# Patient Record
Sex: Male | Born: 1955
Health system: Southern US, Community
[De-identification: ages and names within clinical notes are randomized; demographics above are authoritative.]

## PROBLEM LIST (undated history)

## (undated) DIAGNOSIS — F1910 Other psychoactive substance abuse, uncomplicated: Secondary | ICD-10-CM

## (undated) DIAGNOSIS — G8929 Other chronic pain: Secondary | ICD-10-CM

## (undated) DIAGNOSIS — I10 Essential (primary) hypertension: Secondary | ICD-10-CM

## (undated) DIAGNOSIS — R7303 Prediabetes: Secondary | ICD-10-CM

## (undated) DIAGNOSIS — N189 Chronic kidney disease, unspecified: Secondary | ICD-10-CM

## (undated) DIAGNOSIS — E785 Hyperlipidemia, unspecified: Secondary | ICD-10-CM

## (undated) DIAGNOSIS — K219 Gastro-esophageal reflux disease without esophagitis: Secondary | ICD-10-CM

## (undated) DIAGNOSIS — N529 Male erectile dysfunction, unspecified: Secondary | ICD-10-CM

## (undated) DIAGNOSIS — R011 Cardiac murmur, unspecified: Secondary | ICD-10-CM

## (undated) DIAGNOSIS — M549 Dorsalgia, unspecified: Secondary | ICD-10-CM

## (undated) HISTORY — DX: Prediabetes: R73.03

## (undated) HISTORY — DX: Essential (primary) hypertension: I10

## (undated) HISTORY — DX: Cardiac murmur, unspecified: R01.1

## (undated) HISTORY — DX: Male erectile dysfunction, unspecified: N52.9

## (undated) HISTORY — DX: Hyperlipidemia, unspecified: E78.5

## (undated) HISTORY — DX: Dorsalgia, unspecified: M54.9

## (undated) HISTORY — DX: Other psychoactive substance abuse, uncomplicated: F19.10

## (undated) HISTORY — DX: Other chronic pain: G89.29

## (undated) HISTORY — DX: Chronic kidney disease, unspecified: N18.9

---

## 1994-11-21 HISTORY — PX: OTHER SURGICAL HISTORY: SHX169

## 1999-03-10 ENCOUNTER — Encounter: Admission: RE | Admit: 1999-03-10 | Discharge: 1999-03-10 | Payer: Self-pay | Admitting: Family Medicine

## 1999-04-01 ENCOUNTER — Ambulatory Visit (HOSPITAL_COMMUNITY): Admission: RE | Admit: 1999-04-01 | Discharge: 1999-04-01 | Payer: Self-pay | Admitting: Family Medicine

## 1999-04-01 ENCOUNTER — Encounter: Admission: RE | Admit: 1999-04-01 | Discharge: 1999-04-01 | Payer: Self-pay | Admitting: Family Medicine

## 1999-04-02 ENCOUNTER — Encounter: Admission: RE | Admit: 1999-04-02 | Discharge: 1999-04-02 | Payer: Self-pay | Admitting: Family Medicine

## 1999-04-14 ENCOUNTER — Ambulatory Visit (HOSPITAL_COMMUNITY): Admission: RE | Admit: 1999-04-14 | Discharge: 1999-04-14 | Payer: Self-pay | Admitting: Family Medicine

## 1999-04-16 ENCOUNTER — Encounter: Admission: RE | Admit: 1999-04-16 | Discharge: 1999-04-16 | Payer: Self-pay | Admitting: Family Medicine

## 2001-04-18 ENCOUNTER — Encounter: Admission: RE | Admit: 2001-04-18 | Discharge: 2001-04-18 | Payer: Self-pay | Admitting: Family Medicine

## 2001-11-15 ENCOUNTER — Encounter: Admission: RE | Admit: 2001-11-15 | Discharge: 2001-11-15 | Payer: Self-pay | Admitting: Family Medicine

## 2001-11-26 ENCOUNTER — Encounter: Admission: RE | Admit: 2001-11-26 | Discharge: 2001-11-26 | Payer: Self-pay | Admitting: Sports Medicine

## 2002-02-18 ENCOUNTER — Ambulatory Visit: Admission: RE | Admit: 2002-02-18 | Discharge: 2002-02-18 | Payer: Self-pay | Admitting: Internal Medicine

## 2002-05-13 ENCOUNTER — Encounter: Admission: RE | Admit: 2002-05-13 | Discharge: 2002-05-13 | Payer: Self-pay | Admitting: Family Medicine

## 2002-08-12 ENCOUNTER — Encounter: Admission: RE | Admit: 2002-08-12 | Discharge: 2002-08-12 | Payer: Self-pay | Admitting: Family Medicine

## 2002-12-05 ENCOUNTER — Encounter: Admission: RE | Admit: 2002-12-05 | Discharge: 2002-12-05 | Payer: Self-pay | Admitting: Family Medicine

## 2005-01-04 ENCOUNTER — Ambulatory Visit: Payer: Self-pay | Admitting: Sports Medicine

## 2005-01-05 ENCOUNTER — Ambulatory Visit: Payer: Self-pay | Admitting: Family Medicine

## 2005-03-03 ENCOUNTER — Ambulatory Visit: Payer: Self-pay | Admitting: Family Medicine

## 2007-01-18 DIAGNOSIS — E785 Hyperlipidemia, unspecified: Secondary | ICD-10-CM | POA: Insufficient documentation

## 2007-01-18 DIAGNOSIS — F528 Other sexual dysfunction not due to a substance or known physiological condition: Secondary | ICD-10-CM | POA: Insufficient documentation

## 2007-01-18 DIAGNOSIS — E669 Obesity, unspecified: Secondary | ICD-10-CM | POA: Insufficient documentation

## 2007-04-19 ENCOUNTER — Ambulatory Visit: Payer: Self-pay | Admitting: Family Medicine

## 2007-04-19 ENCOUNTER — Encounter: Payer: Self-pay | Admitting: Family Medicine

## 2007-04-19 LAB — CONVERTED CEMR LAB
Albumin: 4.4 g/dL (ref 3.5–5.2)
Alkaline Phosphatase: 44 units/L (ref 39–117)
CO2: 26 meq/L (ref 19–32)
Glucose, Bld: 95 mg/dL (ref 70–99)
LDL Cholesterol: 151 mg/dL — ABNORMAL HIGH (ref 0–99)
Potassium: 4 meq/L (ref 3.5–5.3)
Sodium: 142 meq/L (ref 135–145)
Total Protein: 7.1 g/dL (ref 6.0–8.3)
Triglycerides: 89 mg/dL (ref <150)

## 2007-04-20 ENCOUNTER — Encounter: Payer: Self-pay | Admitting: Family Medicine

## 2007-04-27 ENCOUNTER — Telehealth: Payer: Self-pay | Admitting: Family Medicine

## 2007-05-01 ENCOUNTER — Ambulatory Visit: Payer: Self-pay | Admitting: Vascular Surgery

## 2007-05-01 ENCOUNTER — Telehealth: Payer: Self-pay | Admitting: *Deleted

## 2007-05-01 ENCOUNTER — Ambulatory Visit (HOSPITAL_COMMUNITY): Admission: RE | Admit: 2007-05-01 | Discharge: 2007-05-01 | Payer: Self-pay | Admitting: Family Medicine

## 2008-12-15 ENCOUNTER — Ambulatory Visit: Payer: Self-pay | Admitting: Family Medicine

## 2008-12-15 ENCOUNTER — Encounter (INDEPENDENT_AMBULATORY_CARE_PROVIDER_SITE_OTHER): Payer: Self-pay | Admitting: Family Medicine

## 2008-12-17 ENCOUNTER — Encounter (INDEPENDENT_AMBULATORY_CARE_PROVIDER_SITE_OTHER): Payer: Self-pay | Admitting: Family Medicine

## 2008-12-19 ENCOUNTER — Ambulatory Visit: Payer: Self-pay | Admitting: Family Medicine

## 2008-12-19 LAB — CONVERTED CEMR LAB
Ketones, urine, test strip: NEGATIVE
Nitrite: NEGATIVE
Protein, U semiquant: NEGATIVE
Urobilinogen, UA: 0.2

## 2008-12-22 ENCOUNTER — Encounter (INDEPENDENT_AMBULATORY_CARE_PROVIDER_SITE_OTHER): Payer: Self-pay | Admitting: Family Medicine

## 2008-12-22 LAB — CONVERTED CEMR LAB
Albumin: 4.3 g/dL (ref 3.5–5.2)
Alkaline Phosphatase: 51 units/L (ref 39–117)
BUN: 13 mg/dL (ref 6–23)
CO2: 25 meq/L (ref 19–32)
Calcium: 9.1 mg/dL (ref 8.4–10.5)
Chloride: 105 meq/L (ref 96–112)
Cholesterol: 192 mg/dL (ref 0–200)
Glucose, Bld: 106 mg/dL — ABNORMAL HIGH (ref 70–99)
HDL: 44 mg/dL (ref >39)
Hemoglobin: 13.5 g/dL (ref 13.0–17.0)
MCHC: 32.3 g/dL (ref 30.0–36.0)
Potassium: 4 meq/L (ref 3.5–5.3)
RBC: 5.14 M/uL (ref 4.22–5.81)
Triglycerides: 70 mg/dL (ref <150)

## 2009-05-22 ENCOUNTER — Ambulatory Visit: Payer: Self-pay | Admitting: Family Medicine

## 2009-05-22 DIAGNOSIS — R7309 Other abnormal glucose: Secondary | ICD-10-CM | POA: Insufficient documentation

## 2009-05-22 DIAGNOSIS — K219 Gastro-esophageal reflux disease without esophagitis: Secondary | ICD-10-CM | POA: Insufficient documentation

## 2009-05-22 LAB — CONVERTED CEMR LAB: Hgb A1c MFr Bld: 6.3 %

## 2010-09-23 ENCOUNTER — Encounter: Payer: Self-pay | Admitting: Family Medicine

## 2010-12-03 ENCOUNTER — Ambulatory Visit
Admission: RE | Admit: 2010-12-03 | Discharge: 2010-12-03 | Payer: Self-pay | Source: Home / Self Care | Attending: Family Medicine | Admitting: Family Medicine

## 2010-12-03 DIAGNOSIS — K089 Disorder of teeth and supporting structures, unspecified: Secondary | ICD-10-CM | POA: Insufficient documentation

## 2010-12-03 DIAGNOSIS — I10 Essential (primary) hypertension: Secondary | ICD-10-CM | POA: Insufficient documentation

## 2010-12-23 ENCOUNTER — Encounter: Payer: Self-pay | Admitting: *Deleted

## 2010-12-23 NOTE — Miscellaneous (Signed)
Summary: update problem list  Clinical Lists Changes  Problems: Removed problem of ANAPHYLACTIC REACTION (ICD-995.0) Removed problem of ANEMIA, OTHER, UNSPECIFIED (ICD-285.9) Removed problem of CAROTID BRUIT, RIGHT (ICD-785.9) Removed problem of SYSTOLIC MURMUR (OJJ-009.2) Removed problem of IRRITABLE BOWEL SYNDROME (ICD-564.1) Observations: Added new observation of PAST MED HX: systolic murmur Anemia-iron deficiency Hyperlipidemia chonic low back pain impotence  Right carotid bruit Pre-diabetic IBS (09/23/2010 12:14)      Past Medical History:    systolic murmur    Anemia-iron deficiency    Hyperlipidemia    chonic low back pain    impotence     Right carotid bruit    Pre-diabetic    IBS

## 2010-12-23 NOTE — Assessment & Plan Note (Signed)
Summary: tooth pain, HTN,df   Vital Signs:  Patient profile:   55 year old male Height:      70 inches Weight:      282 pounds BMI:     40.61 Temp:     98.1 degrees F oral Pulse rate:   75 / minute Pulse rhythm:   regular BP sitting:   165 / 95  (left arm) Cuff size:   large  Vitals Entered By: Loralee Pacas CMA (December 03, 2010 4:39 PM) CC: tooth pain Is Patient Diabetic? No Pain Assessment Patient in pain? no      Comments tooth abcess   Primary Care Provider:  Milinda Antis MD  CC:  tooth pain.  History of Present Illness: 55 yo here for acute appt for evaluation of tooth pain  tooth pain:  chronic dental caries, has been putting off dentistry appt due to financial strain and new job.  for teh past 3 days feels right side of face has been swelling more.  no fever, trouble swallowing, drainage, trouble eating.  Pain well controlled on occaisiona ibuprofen.  hypertension:  patient states he has not followed up with PCP because he is working on diet and exercise adn wante dto make some improvement on this before comign back in as he does not want to be on medications.  NO CP, Dyspnnea  obesity:  states he has lost 20 pounds in the past few months due to working on lifestyle modifcation.  AT patient's last appt with PCP was -prediabetic.  Habits & Providers  Alcohol-Tobacco-Diet     Tobacco Status: quit     Year Quit: 1995  Exercise-Depression-Behavior     Have you felt down or hopeless? no     Have you felt little pleasure in things? no     Depression Counseling: not indicated; screening negative for depression     Seat Belt Use: always  Current Medications (verified): 1)  Viagra 50 Mg Tabs (Sildenafil Citrate) .Marland Kitchen.. 1-2  By Mouth 30 Minutes Prior To Sexual Activity 2)  Omeprazole 40 Mg Cpdr (Omeprazole) .Marland Kitchen.. 1 By Mouth Daily For Heartburn 3)  Epipen 0.3 Mg/0.31ml (1:1000) Devi (Epinephrine Hcl (Anaphylaxis)) .... Use As Directed For [facility] in [place]  Compartment 4)  Penicillin V Potassium 500 Mg Tabs (Penicillin V Potassium) .... One Tab Four Times A Day For 14 Days 5)  Hydrochlorothiazide 25 Mg Tabs (Hydrochlorothiazide) .... Take One Tablet Daily For Blood Pressure  Allergies: No Known Drug Allergies PMH-FH-SH reviewed for relevance  Social History: Risk analyst Use:  always  Review of Systems      See HPI  Physical Exam  General:  Well-developed Overweight,in no acute distress; alert,appropriate and cooperative throughout examination Mouth:  multiple chipped, cracked teeth.  slight facial swelling over righ lower face.  Cracked right upper ? incisor without drainage, obvious swelling or abscess. Lungs:  Normal respiratory effort, chest expands symmetrically. Lungs are clear to auscultation, no crackles or wheezes. Heart:  Normal rate and regular rhythm. S1 and S2 normal without murmur   Impression & Recommendations:  Problem # 1:  DENTAL PAIN (ICD-525.9)  will prescribe penicillin until patietn can see dentistry.  She is insured and states he will be able to be seen in the next week.  Pain well controlled with some ibuprofen.  Orders: [facility]- Est  Level 4 (11914)  Problem # 2:  HYPERTENSION (ICD-401.9)  has been borderline hypertensive in the past, today significantly elevated at 165/95.  patient relcutant  to start medications.  Discussed startign today while he continues to work on weight loss.  WIll only start one agent at this time du eto reluctance but we did discuss that there is a combination pill available which would also be a good choice should his blood pressure remain elevated (lisinopril/hctx given prediabetes)  Patient will follow-up with PCP in several weeks and will obtain fasting labs at that time.  His updated medication list for this problem includes:    Hydrochlorothiazide 25 Mg Tabs (Hydrochlorothiazide) .Marland Kitchen... Take one tablet daily for blood pressure  BP today: 165/95 Prior BP: 140/80 (05/22/2009)  Labs  Reviewed: K+: 4.0 (12/15/2008) Creat: : 0.89 (12/15/2008)   Chol: 192 (12/15/2008)   HDL: 44 (12/15/2008)   LDL: 134 (12/15/2008)   TG: 70 (12/15/2008)  Orders: [facility]- Est  Level 4 (16109)  Problem # 3:  OBESITY, NOS (ICD-278.00)  Congratulated on weight loss and encouraged contiued loss to positively impact his health.  Patient will follow-up with PCP.    Ht: 70 (12/03/2010)   Wt: 282 (12/03/2010)   BMI: 40.61 (12/03/2010)  Orders: [facility]- Est  Level 4 (60454)  Complete Medication List: 1)  Viagra 50 Mg Tabs (Sildenafil citrate) .Marland Kitchen.. 1-2  by mouth 30 minutes prior to sexual activity 2)  Omeprazole 40 Mg Cpdr (Omeprazole) .Marland Kitchen.. 1 by mouth daily for heartburn 3)  Epipen 0.3 Mg/0.61ml (1:1000) Devi (Epinephrine hcl (anaphylaxis)) .... Use as directed for bee stings store in cool compartment 4)  Penicillin V Potassium 500 Mg Tabs (Penicillin v potassium) .... One tab four times a day for 14 days 5)  Hydrochlorothiazide 25 Mg Tabs (Hydrochlorothiazide) .... Take one tablet daily for blood pressure  Patient Instructions: 1)  I will send penicillin to yoru pharmacy.  make dentist appt asap. 2)  Blood pressure medicine is HCTZ 3)  Follow-up in 3-4 weeks, with your regular doctor- will need fasting bloodwork at that time Prescriptions: HYDROCHLOROTHIAZIDE 25 MG TABS (HYDROCHLOROTHIAZIDE) take one tablet daily for blood pressure  #30 x 1   Entered and Authorized by:   Delbert Harness MD   Signed by:   Delbert Harness MD on 12/03/2010   Method used:   Electronically to        [facility] [address] [id number]* (retail)       [address] [place], [place]  [address]       Ph: [phone]       Fax: [phone]   RxID:   [id number] PENICILLIN V POTASSIUM 500 MG TABS (PENICILLIN V POTASSIUM) one tab four times a day for 14 days  #56 x 0   Entered and Authorized by:   Delbert Harness MD   Signed by:   Delbert Harness MD on 12/03/2010   Method used:   Electronically to        [facility] [address]  #[id number]* (retail)       [address] [place], [place]  [address]       Ph: [phone]       Fax: [phone]   RxID:   [id number]    Orders Added: 1)  [facility]- Est  Level 4 [57322]

## 2010-12-31 ENCOUNTER — Encounter: Payer: Self-pay | Admitting: Family Medicine

## 2010-12-31 ENCOUNTER — Ambulatory Visit (INDEPENDENT_AMBULATORY_CARE_PROVIDER_SITE_OTHER): Payer: PRIVATE HEALTH INSURANCE | Admitting: Family Medicine

## 2010-12-31 VITALS — BP 127/77 | HR 56 | Temp 98.1°F | Ht 70.0 in | Wt 275.0 lb

## 2010-12-31 DIAGNOSIS — R7309 Other abnormal glucose: Secondary | ICD-10-CM

## 2010-12-31 DIAGNOSIS — E785 Hyperlipidemia, unspecified: Secondary | ICD-10-CM

## 2010-12-31 DIAGNOSIS — I10 Essential (primary) hypertension: Secondary | ICD-10-CM

## 2010-12-31 DIAGNOSIS — E669 Obesity, unspecified: Secondary | ICD-10-CM

## 2010-12-31 DIAGNOSIS — R490 Dysphonia: Secondary | ICD-10-CM

## 2010-12-31 LAB — COMPREHENSIVE METABOLIC PANEL
ALT: 23 U/L (ref 0–53)
Albumin: 4.6 g/dL (ref 3.5–5.2)
CO2: 27 mEq/L (ref 19–32)
Calcium: 9.2 mg/dL (ref 8.4–10.5)
Chloride: 103 mEq/L (ref 96–112)
Creat: 0.97 mg/dL (ref 0.40–1.50)
Potassium: 4 mEq/L (ref 3.5–5.3)

## 2010-12-31 LAB — CONVERTED CEMR LAB
ALT: 23 units/L (ref 0–53)
AST: 22 units/L (ref 0–37)
Alkaline Phosphatase: 43 units/L (ref 39–117)
CO2: 27 meq/L (ref 19–32)
Cholesterol: 230 mg/dL — ABNORMAL HIGH (ref 0–200)
Creatinine, Ser: 0.97 mg/dL (ref 0.40–1.50)
LDL Cholesterol: 170 mg/dL — ABNORMAL HIGH (ref 0–99)
Sodium: 140 meq/L (ref 135–145)
Total Bilirubin: 0.5 mg/dL (ref 0.3–1.2)
Total CHOL/HDL Ratio: 6.1
Total Protein: 7.2 g/dL (ref 6.0–8.3)
VLDL: 22 mg/dL (ref 0–40)

## 2010-12-31 LAB — LIPID PANEL
Cholesterol: 230 mg/dL — ABNORMAL HIGH (ref 0–200)
HDL: 38 mg/dL — ABNORMAL LOW (ref >39)
Total CHOL/HDL Ratio: 6.1 Ratio

## 2010-12-31 LAB — POCT GLYCOSYLATED HEMOGLOBIN (HGB A1C): Hemoglobin A1C: 6.3

## 2010-12-31 NOTE — Progress Notes (Signed)
  Subjective:    Patient ID: Carlos Knight, male    DOB: [birth year], 55 y.o.   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 He would like to f/u with ENT had T & A done 15 years ago. Currently no pain, denies cough, runny nose, fever, sore throat, throat does get itchy                       Has not seen dentist- has not had time, completed antibiotics   Review of Systems Per above     Objective:   Physical Exam GEN- NAD, vitals noted HEENT- no LAD, MMM, no injection of oropharynx, tonsils no present CVS- RRR, no murmur Carotid-no bruit RESP- CTAB Ext- no edema        Assessment & Plan:

## 2010-12-31 NOTE — Assessment & Plan Note (Signed)
No meds, check FLP

## 2010-12-31 NOTE — Assessment & Plan Note (Signed)
Intentional weight loss, pt goal < 200lbs Encouraged to add the cardio part to his regimine

## 2010-12-31 NOTE — Assessment & Plan Note (Signed)
Check A1C, previous 6.3%  Evaluate for meds pending results

## 2010-12-31 NOTE — Assessment & Plan Note (Signed)
Improved on HCTZ, continue current dose Pre diabetic, goal < 130/80

## 2010-12-31 NOTE — Assessment & Plan Note (Signed)
No evidence of acute infection GERD does not seem to be contributing at this time Likely secondary to vocal strain with his many engagements with his voice, refer back to ENT, may need scope

## 2010-12-31 NOTE — Patient Instructions (Signed)
Continue your blood pressure medication I will call you with lab results  Please schedule a visit for your physical

## 2011-01-03 ENCOUNTER — Encounter: Payer: Self-pay | Admitting: Family Medicine

## 2011-01-03 ENCOUNTER — Telehealth: Payer: Self-pay | Admitting: Family Medicine

## 2011-01-03 DIAGNOSIS — E785 Hyperlipidemia, unspecified: Secondary | ICD-10-CM

## 2011-01-03 MED ORDER — SIMVASTATIN 20 MG PO TABS: 20.0000 mg | ORAL_TABLET | Freq: Every evening | ORAL | Status: AC

## 2011-01-03 NOTE — Telephone Encounter (Signed)
Left message for pt to return calls I have written a letter with his lab result as well His A1C was 6.3% His cholesterol was elevated at 230, bad cholesterol was 170, with his hypertension and pre-diabetes, his TC should be less than 200 and LDL 100. Advised pt to start Zocor (Simvastatin)  20mg  at bedtime, this was sent in

## 2011-01-10 ENCOUNTER — Telehealth: Payer: Self-pay | Admitting: Family Medicine

## 2011-01-10 NOTE — Telephone Encounter (Signed)
Discussed labs with pt, he has started he Zocor Awaiting ENT referral to be set up, told him we will call when this is complete

## 2011-01-13 ENCOUNTER — Telehealth: Payer: Self-pay | Admitting: *Deleted

## 2011-01-18 NOTE — Telephone Encounter (Signed)
Pt never called back. Re: insurance [place], [place]

## 2011-01-19 ENCOUNTER — Telehealth: Payer: Self-pay | Admitting: Family Medicine

## 2011-01-19 NOTE — Telephone Encounter (Signed)
Error

## 2011-01-27 ENCOUNTER — Telehealth: Payer: Self-pay | Admitting: *Deleted

## 2011-01-27 NOTE — Telephone Encounter (Signed)
Called patient and left message to return call. Patient needs to call insurance and find out which ENT will accept his coverage.[place], Rodena Medin

## 2011-01-31 ENCOUNTER — Telehealth: Payer: Self-pay | Admitting: *Deleted

## 2011-01-31 NOTE — Telephone Encounter (Signed)
Spoke with patient, told him I could not find an ENT doctor that accepts his insurance. Asked him to call and find a physician in his plan and I would schedule him an appointment.Arwin Bisceglia, Rodena Medin

## 2011-02-01 ENCOUNTER — Other Ambulatory Visit: Payer: Self-pay | Admitting: Family Medicine

## 2011-02-01 NOTE — Telephone Encounter (Signed)
Refill request

## 2011-03-03 ENCOUNTER — Other Ambulatory Visit: Payer: Self-pay | Admitting: Family Medicine

## 2011-03-03 MED ORDER — HYDROCHLOROTHIAZIDE 25 MG PO TABS: ORAL_TABLET | ORAL | Status: DC

## 2011-10-31 ENCOUNTER — Ambulatory Visit: Payer: Self-pay

## 2012-05-14 ENCOUNTER — Emergency Department (HOSPITAL_COMMUNITY)
Admission: EM | Admit: 2012-05-14 | Discharge: 2012-05-14 | Disposition: A | Discharge status: Home / Self Care | Payer: Self-pay | Source: Home / Self Care

## 2012-05-14 ENCOUNTER — Ambulatory Visit: Payer: PRIVATE HEALTH INSURANCE | Admitting: Family Medicine

## 2012-05-14 ENCOUNTER — Encounter (HOSPITAL_COMMUNITY): Payer: Self-pay | Admitting: *Deleted

## 2012-05-14 DIAGNOSIS — J069 Acute upper respiratory infection, unspecified: Secondary | ICD-10-CM

## 2012-05-14 DIAGNOSIS — H60399 Other infective otitis externa, unspecified ear: Secondary | ICD-10-CM

## 2012-05-14 DIAGNOSIS — H6090 Unspecified otitis externa, unspecified ear: Secondary | ICD-10-CM

## 2012-05-14 DIAGNOSIS — I10 Essential (primary) hypertension: Secondary | ICD-10-CM

## 2012-05-14 LAB — POCT RAPID STREP A: Streptococcus, Group A Screen (Direct): NEGATIVE

## 2012-05-14 MED ORDER — HYDROCHLOROTHIAZIDE 25 MG PO TABS: ORAL_TABLET | ORAL | Status: DC

## 2012-05-14 MED ORDER — CIPROFLOXACIN-HYDROCORTISONE 0.2-1 % OT SUSP: 3.0000 [drp] | Freq: Two times a day (BID) | OTIC | Status: AC

## 2012-05-14 NOTE — ED Provider Notes (Signed)
Carlos Knight is a 56 y.o. male who presents to Urgent Care today for left ear pain associated with throat pain. Symptoms started a few days ago. Patient has been taking Aleve which has helped some. He denies any trouble breathing or trouble swallowing. He also notes ear pressure on the left side. He denies any discharge. He feels well otherwise.   PMH reviewed. Significant for hypertension not currently taking any medications. History  Substance Use Topics  . Smoking status: Former Games developer  . Smokeless tobacco: Not on file  . Alcohol Use: No   ROS as above Medications reviewed. No current facility-administered medications for this encounter.   Current Outpatient Prescriptions  Medication Sig Dispense Refill  . aspirin 81 MG tablet Take 81 mg by mouth daily.        Marland Kitchen EPINEPHrine (EPIPEN) 0.3 mg/0.3 mL DEVI Inject 0.3 mg into the muscle once. As directed for bee stings.  Store in cool compartment       . hydrochlorothiazide 25 MG tablet TAKE 1 TABLET BY MOUTH EVERY DAY FOR BLOOD PRESSURE  30 tablet  6    Exam:  BP 160/77  Pulse 74  Temp 100.5 F (38.1 C) (Oral)  Resp 18  SpO2 97% Gen: Well NAD, obese HEENT: EOMI,  MMM, left tympanic membrane erythematous and cloudy, but not tense. Right tympanic membrane scarred without any significant erythema. Posterior pharynx is erythematous without exudate. Lungs: CTABL Nl WOB Heart: RRR no MRG Exts: Non edematous BL  LE, warm and well perfused.   No results found for this or any previous visit (from the past 24 hour(s)). No results found.  Assessment and Plan: 56 y.o. male with   1) otitis externa of the left ear. Plan to treat with ciprofloxacin hydrocortisone ear drops.  2) viral URI. Likely responsible for the sore throat. May also be the ultimate cause of findings of the left ear. Plan to treat with Tylenol or ibuprofen as needed.  3) hypertension. Currently not taking any medications. Recommended resuming hydrochlorothiazide. 3  month supply prescribed. Encouraged patient followup with his primary care doctor in one month.  Discussed warning signs or symptoms. Please see discharge instructions. Patient expresses understanding.      Carlos Bong, MD 05/14/12 [phone]

## 2012-05-14 NOTE — Discharge Instructions (Signed)
Thank you for coming in today. You have an infection of your ear drum in your left ear.  Please use the drops twice a day for 1 week.  Continue pain medici cations as needed.  Please also re-start your blood pressure medicine daily.  Your blood pressure is too high.  Please follow up with the [facility] in 1 month.  Please see Rudell Cobb to qualify for reduced or free medical services within the [facility].  Call her at [phone] today. Call or go to the emergency room if you get worse, have trouble breathing, have chest pains, or palpitations.

## 2012-05-14 NOTE — ED Notes (Signed)
Pt  Reports  l  Ear  Pain  With  Congested  And  sorethroat     X  3  Days   Pt  Is      Awake  And  Alert   And   Oriented             Pt  Is  Speaking in  Complete  sentances          Skin is  Warm   And  Dry

## 2012-05-14 NOTE — ED Provider Notes (Signed)
Medical screening examination/treatment/procedure(s) were performed by a resident physician and as supervising physician I was immediately available for consultation/collaboration.  Leslee Home, M.D.   Reuben Likes, MD 05/14/12 2052

## 2012-10-08 ENCOUNTER — Other Ambulatory Visit: Payer: Self-pay | Admitting: Family Medicine

## 2012-11-12 ENCOUNTER — Telehealth: Payer: Self-pay | Admitting: *Deleted

## 2012-11-12 NOTE — Telephone Encounter (Signed)
Patient came to office today at 3:45 PM  wanting a work in appointment today.  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  Has chest congestion. Advised that we do not have any available appointment left today  and would recommend he go to Urgent Care to be evaluated. He does not want to do this has been there before and was not happy after the visit.  Offered to schedule an appointment 12/26 but he will be going back  on his truck route.  Again encouraged him to go to [facility]

## 2012-11-16 ENCOUNTER — Other Ambulatory Visit: Payer: Self-pay | Admitting: *Deleted

## 2012-11-16 MED ORDER — HYDROCHLOROTHIAZIDE 25 MG PO TABS: 25.0000 mg | ORAL_TABLET | Freq: Every day | ORAL | Status: DC

## 2013-09-01 ENCOUNTER — Emergency Department (HOSPITAL_COMMUNITY)
Admission: EM | Admit: 2013-09-01 | Discharge: 2013-09-01 | Disposition: A | Discharge status: Home / Self Care | Payer: PRIVATE HEALTH INSURANCE | Attending: Emergency Medicine | Admitting: Emergency Medicine

## 2013-09-01 ENCOUNTER — Encounter (HOSPITAL_COMMUNITY): Payer: Self-pay | Admitting: Emergency Medicine

## 2013-09-01 DIAGNOSIS — I10 Essential (primary) hypertension: Secondary | ICD-10-CM | POA: Insufficient documentation

## 2013-09-01 DIAGNOSIS — Z87891 Personal history of nicotine dependence: Secondary | ICD-10-CM | POA: Insufficient documentation

## 2013-09-01 DIAGNOSIS — X500XXA Overexertion from strenuous movement or load, initial encounter: Secondary | ICD-10-CM | POA: Insufficient documentation

## 2013-09-01 DIAGNOSIS — R209 Unspecified disturbances of skin sensation: Secondary | ICD-10-CM | POA: Insufficient documentation

## 2013-09-01 DIAGNOSIS — S39012A Strain of muscle, fascia and tendon of lower back, initial encounter: Secondary | ICD-10-CM

## 2013-09-01 DIAGNOSIS — IMO0002 Reserved for concepts with insufficient information to code with codable children: Secondary | ICD-10-CM | POA: Insufficient documentation

## 2013-09-01 DIAGNOSIS — E669 Obesity, unspecified: Secondary | ICD-10-CM | POA: Insufficient documentation

## 2013-09-01 DIAGNOSIS — R011 Cardiac murmur, unspecified: Secondary | ICD-10-CM | POA: Insufficient documentation

## 2013-09-01 DIAGNOSIS — S335XXA Sprain of ligaments of lumbar spine, initial encounter: Secondary | ICD-10-CM | POA: Insufficient documentation

## 2013-09-01 DIAGNOSIS — Z87448 Personal history of other diseases of urinary system: Secondary | ICD-10-CM | POA: Insufficient documentation

## 2013-09-01 DIAGNOSIS — M5416 Radiculopathy, lumbar region: Secondary | ICD-10-CM

## 2013-09-01 DIAGNOSIS — Y9389 Activity, other specified: Secondary | ICD-10-CM | POA: Insufficient documentation

## 2013-09-01 DIAGNOSIS — Y929 Unspecified place or not applicable: Secondary | ICD-10-CM | POA: Insufficient documentation

## 2013-09-01 DIAGNOSIS — Z8639 Personal history of other endocrine, nutritional and metabolic disease: Secondary | ICD-10-CM | POA: Insufficient documentation

## 2013-09-01 DIAGNOSIS — Z79899 Other long term (current) drug therapy: Secondary | ICD-10-CM | POA: Insufficient documentation

## 2013-09-01 DIAGNOSIS — G8929 Other chronic pain: Secondary | ICD-10-CM | POA: Insufficient documentation

## 2013-09-01 DIAGNOSIS — Z862 Personal history of diseases of the blood and blood-forming organs and certain disorders involving the immune mechanism: Secondary | ICD-10-CM | POA: Insufficient documentation

## 2013-09-01 MED ORDER — HYDROCODONE-ACETAMINOPHEN 5-325 MG PO TABS: ORAL_TABLET | ORAL | Status: DC

## 2013-09-01 MED ORDER — DIAZEPAM 5 MG PO TABS: 5.0000 mg | ORAL_TABLET | Freq: Three times a day (TID) | ORAL | Status: DC | PRN

## 2013-09-01 MED ORDER — HYDROMORPHONE HCL PF 2 MG/ML IJ SOLN
2.0000 mg | Freq: Once | INTRAMUSCULAR | Status: AC
  Administered 2013-09-01: 2 mg via INTRAMUSCULAR
  Filled 2013-09-01: qty 1

## 2013-09-01 MED ORDER — PREDNISONE 20 MG PO TABS: 40.0000 mg | ORAL_TABLET | Freq: Every day | ORAL | Status: DC

## 2013-09-01 MED ORDER — KETOROLAC TROMETHAMINE 30 MG/ML IJ SOLN
30.0000 mg | Freq: Once | INTRAMUSCULAR | Status: AC
  Administered 2013-09-01: 30 mg via INTRAMUSCULAR
  Filled 2013-09-01: qty 1

## 2013-09-01 NOTE — ED Notes (Signed)
Pt c/o diffuse lower back pain and pain that radiates into bilateral legs, sts his left leg has been feeling numb on and off. Pt ambulated to room with no issues. sts this all started a week ago, was able to go to work the past 3 days but thinks this made his pain worse. Pt in nad, skin warm and dry, resp e/u. No neuro deficits.

## 2013-09-01 NOTE — ED Provider Notes (Signed)
CSN: [id number]     Arrival date & time 09/01/13  0747 History   First MD Initiated Contact with Patient 09/01/13 [phone]     Chief Complaint  Patient presents with  . Back Pain   (Consider location/radiation/quality/duration/timing/severity/associated sxs/prior Treatment) Patient is a 57 y.o. male presenting with back pain. The history is provided by the patient and the spouse. No language interpreter was used.  Back Pain Location:  Lumbar spine Quality:  Burning, aching and stiffness Stiffness is present:  In the morning Radiates to:  L posterior upper leg, L thigh, R thigh, L knee and L foot Pain severity:  Severe Worse during: more stiff in early morning, may improve some during day, now is severe constantly. Onset quality:  Gradual Duration:  1 week Timing:  Constant Progression:  Worsening Chronicity:  Chronic Context: lifting heavy objects and twisting   Context: not falling and not jumping from heights   Relieved by:  NSAIDs, OTC medications and bed rest Worsened by:  Bending, ambulation, movement, standing, sitting and twisting Ineffective treatments:  Bed rest and being still Associated symptoms: numbness and paresthesias   Associated symptoms: no bladder incontinence, no fever, no perianal numbness, no weakness and no weight loss   Risk factors: obesity     Past Medical History  Diagnosis Date  . Hypertension   . Hyperlipidemia   . Pre-diabetes   . Cardiac murmur     since child hood takes prophylactic antibiotics before dental procedures  . Back pain, chronic   . Impotence     previously on viagra   Past Surgical History  Procedure Laterality Date  . Tonsillectomy  1996    adenoids also removed   Family History  Problem Relation Age of Onset  . Stroke Mother   . Asthma Brother   . Cancer Paternal Grandmother    History  Substance Use Topics  . Smoking status: Former Games developer  . Smokeless tobacco: Not on file  . Alcohol Use: No    Review of Systems    Constitutional: Negative for fever and weight loss.  Genitourinary: Negative for bladder incontinence.  Musculoskeletal: Positive for back pain.  Neurological: Positive for numbness and paresthesias. Negative for weakness.    Allergies  Bee venom  Home Medications   Current Outpatient Rx  Name  Route  Sig  Dispense  Refill  . Aspirin-Caffeine (BAYER BACK & BODY PAIN EX ST) 500-32.5 MG TABS   Oral   Take 2 tablets by mouth daily as needed (Pain).         . Multiple Vitamin (MULTIVITAMIN WITH MINERALS) TABS tablet   Oral   Take 1 tablet by mouth daily.         . naproxen sodium (ANAPROX) 220 MG tablet   Oral   Take 440 mg by mouth daily as needed (Pain).         . diazepam (VALIUM) 5 MG tablet   Oral   Take 1 tablet (5 mg total) by mouth every 8 (eight) hours as needed (muscle spasms).   12 tablet   0   . EPINEPHrine (EPIPEN) 0.3 mg/0.3 mL DEVI   Intramuscular   Inject 0.3 mg into the muscle once. As directed for bee stings.  Store in cool compartment          . HYDROcodone-acetaminophen (NORCO/VICODIN) 5-325 MG per tablet      1-2 tablets po q 6 hours prn moderate to severe pain   20 tablet   0   .  predniSONE (DELTASONE) 20 MG tablet   Oral   Take 2 tablets (40 mg total) by mouth daily.   12 tablet   0    BP 118/72  Pulse 56  Temp(Src) 98.4 F (36.9 C) (Oral)  Resp 18  Ht 5\' 10"  (1.778 m)  Wt 288 lb (130.636 kg)  BMI 41.32 kg/m2  SpO2 95% Physical Exam  Nursing note and vitals reviewed. Constitutional: He is oriented to person, place, and time. He appears well-developed and well-nourished.  HENT:  Head: Normocephalic and atraumatic.  Eyes: No scleral icterus.  Neck: Normal range of motion. Neck supple.  Cardiovascular: Normal rate, regular rhythm and intact distal pulses.   Pulmonary/Chest: Effort normal.  Abdominal: Soft.  Musculoskeletal:       Lumbar back: He exhibits tenderness, pain and spasm. He exhibits no swelling, no deformity and  normal pulse.       Back:  Neurological: He is alert and oriented to person, place, and time. He has normal strength. No sensory deficit. He exhibits normal muscle tone.  Reflex Scores:      Patellar reflexes are 2+ on the right side and 2+ on the left side. Skin: Skin is warm and dry. No rash noted. No pallor.  Psychiatric: He has a normal mood and affect.    ED Course  Procedures (including critical care time) Labs Review Labs Reviewed - No data to display Imaging Review No results found.  EKG Interpretation   None       Patient was feeling improved after IM medications. I spoke to family practice who has given him a followup appointment for Friday.  MDM   1. Lumbar strain, initial encounter   2. Radiculopathy of lumbar region     Pt with musculoskeletal pain in my opinion, however some radicular symptoms with pain and shooting sensation going down left leg in particular, down to foot.  May need MRI as outpt if symptoms are not improving.  Will put on steroids, pain meds, valium as outpt and contact PCP to arrange appropriate follow up for next week.      Gavin Pound. Oletta Lamas, MD 09/01/13 1610

## 2013-09-01 NOTE — ED Notes (Signed)
Lower back pain , Pt. Reports injuring his back last week while lifting.  This week has developed numbness to his lt. Leg. Also experiencing muscle spasms in both legs

## 2013-09-01 NOTE — Discharge Instructions (Signed)
 Lumbosacral Radiculopathy Lumbosacral radiculopathy is a pinched nerve or nerves in the low back (lumbosacral area). When this happens you may have weakness in your legs and may not be able to stand on your toes. You may have pain going down into your legs. There may be difficulties with walking normally. There are many causes of this problem. Sometimes this may happen from an injury, or simply from arthritis or boney problems. It may also be caused by other illnesses such as diabetes. If there is no improvement after treatment, further studies may be done to find the exact cause. DIAGNOSIS  X-rays may be needed if the problems become long standing. Electromyograms may be done. This study is one in which the working of nerves and muscles is studied. HOME CARE INSTRUCTIONS   Applications of ice packs may be helpful. Ice can be used in a plastic bag with a towel around it to prevent frostbite to skin. This may be used every 2 hours for 20 to 30 minutes, or as needed, while awake, or as directed by your caregiver.  Only take over-the-counter or prescription medicines for pain, discomfort, or fever as directed by your caregiver.  If physical therapy was prescribed, follow your caregiver's directions. SEEK IMMEDIATE MEDICAL CARE IF:   You have pain not controlled with medications.  You seem to be getting worse rather than better.  You develop increasing weakness in your legs.  You develop loss of bowel or bladder control.  You have difficulty with walking or balance, or develop clumsiness in the use of your legs.  You have a fever. MAKE SURE YOU:   Understand these instructions.  Will watch your condition.  Will get help right away if you are not doing well or get worse. Document Released: 11/07/2005 Document Revised: 01/30/2012 Document Reviewed: 06/27/2008 [facility] Patient Information 2014 [place], [place].    Lumbosacral Strain Lumbosacral strain is one of the most common causes of  back pain. There are many causes of back pain. Most are not serious conditions. CAUSES  Your backbone (spinal column) is made up of 24 main vertebral bodies, the sacrum, and the coccyx. These are held together by muscles and tough, fibrous tissue (ligaments). Nerve roots pass through the openings between the vertebrae. A sudden move or injury to the back may cause injury to, or pressure on, these nerves. This may result in localized back pain or pain movement (radiation) into the buttocks, down the leg, and into the foot. Sharp, shooting pain from the buttock down the back of the leg (sciatica) is frequently associated with a ruptured (herniated) disk. Pain may be caused by muscle spasm alone. Your caregiver can often find the cause of your pain by the details of your symptoms and an exam. In some cases, you may need tests (such as X-rays). Your caregiver will work with you to decide if any tests are needed based on your specific exam. HOME CARE INSTRUCTIONS   Avoid an underactive lifestyle. Active exercise, as directed by your caregiver, is your greatest weapon against back pain.  Avoid hard physical activities (tennis, racquetball, waterskiing) if you are not in proper physical condition for it. This may aggravate or create problems.  If you have a back problem, avoid sports requiring sudden body movements. Swimming and walking are generally safer activities.  Maintain good posture.  Avoid becoming overweight (obese).  Use bed rest for only the most extreme, sudden (acute) episode. Your caregiver will help you determine how much bed rest is necessary.  For acute conditions, you may put ice on the injured area.  Put ice in a plastic bag.  Place a towel between your skin and the bag.  Leave the ice on for 15-20 minutes at a time, every 2 hours, or as needed.  After you are improved and more active, it may help to apply heat for 30 minutes before activities. See your caregiver if you are  having pain that lasts longer than expected. Your caregiver can advise appropriate exercises or therapy if needed. With conditioning, most back problems can be avoided. SEEK IMMEDIATE MEDICAL CARE IF:   You have numbness, tingling, weakness, or problems with the use of your arms or legs.  You experience severe back pain not relieved with medicines.  There is a change in bowel or bladder control.  You have increasing pain in any area of the body, including your belly (abdomen).  You notice shortness of breath, dizziness, or feel faint.  You feel sick to your stomach (nauseous), are throwing up (vomiting), or become sweaty.  You notice discoloration of your toes or legs, or your feet get very cold.  Your back pain is getting worse.  You have a fever. MAKE SURE YOU:   Understand these instructions.  Will watch your condition.  Will get help right away if you are not doing well or get worse. Document Released: 08/17/2005 Document Revised: 01/30/2012 Document Reviewed: 02/06/2009 [facility] Patient Information 2014 [place], [place].   Narcotic and benzodiazepine use may cause drowsiness, slowed breathing or dependence.  Please use with caution and do not drive, operate machinery or watch young children alone while taking them.  Taking combinations of these medications or drinking alcohol will potentiate these effects.

## 2013-09-01 NOTE — ED Notes (Signed)
Pt talking with pharmacy in triage, pharmacy tech will bring pt back to room when finished.

## 2013-09-04 ENCOUNTER — Encounter: Payer: Self-pay | Admitting: Emergency Medicine

## 2013-09-04 ENCOUNTER — Ambulatory Visit (INDEPENDENT_AMBULATORY_CARE_PROVIDER_SITE_OTHER): Payer: PRIVATE HEALTH INSURANCE | Admitting: Emergency Medicine

## 2013-09-04 ENCOUNTER — Ambulatory Visit (HOSPITAL_COMMUNITY)
Admission: RE | Admit: 2013-09-04 | Discharge: 2013-09-04 | Disposition: A | Discharge status: Home / Self Care | Payer: PRIVATE HEALTH INSURANCE | Source: Ambulatory Visit | Attending: Family Medicine | Admitting: Family Medicine

## 2013-09-04 VITALS — BP 153/84 | HR 66 | Temp 98.6°F | Ht 70.0 in | Wt 279.0 lb

## 2013-09-04 DIAGNOSIS — M545 Low back pain, unspecified: Secondary | ICD-10-CM | POA: Insufficient documentation

## 2013-09-04 DIAGNOSIS — M5416 Radiculopathy, lumbar region: Secondary | ICD-10-CM

## 2013-09-04 DIAGNOSIS — M51379 Other intervertebral disc degeneration, lumbosacral region without mention of lumbar back pain or lower extremity pain: Secondary | ICD-10-CM | POA: Insufficient documentation

## 2013-09-04 DIAGNOSIS — M5137 Other intervertebral disc degeneration, lumbosacral region: Secondary | ICD-10-CM | POA: Insufficient documentation

## 2013-09-04 DIAGNOSIS — IMO0002 Reserved for concepts with insufficient information to code with codable children: Secondary | ICD-10-CM

## 2013-09-04 MED ORDER — HYDROCODONE-ACETAMINOPHEN 5-325 MG PO TABS: 1.0000 | ORAL_TABLET | Freq: Four times a day (QID) | ORAL | Status: DC | PRN

## 2013-09-04 NOTE — Patient Instructions (Signed)
It was nice to meet you! I'm sorry your back and leg are bothering you.  Please get your x-ray at [facility] by the end of the week. Take Norco 1-2 tablets every 4 hours as needed. Finish the steroids you got at the ER.  I gave you a handout with some back exercises.  Please do these once a day if able.  Follow up in 2 weeks.  If you start having worsening weakness, numbness or are unable to control your bowel or bladder, you need to be seen right away.

## 2013-09-04 NOTE — Progress Notes (Signed)
  Subjective:    Patient ID: Carlos Knight, male    DOB: 13-Feb-1956, 57 y.o.   MRN: [id number]  HPI Carlos Knight is here for ed f/u for back and left leg pain.  He reports a long history of chronic lumbar back pain.  States it has been going on for at least 20 years.  He states he gets occasional flares that he manages at home with OTC medication.  About 10 days ago, he states "I over did it," and his back flared up.  When it did not improve over a few days and was getting worse, he went to the ED for evaluation.  He was discharged on a steroid burst, valium and norco.  He reports that the spasms he was having have resolved.  He states it is a little better today, but still severe and prevents him from tying his shoes.  Pain is located primarily in left lower back and buttock, and radiates down to his left foot.  Over the last few days, he has noticed some numbness on the outside of his left leg.  Also reports some subjective weakness of the left leg, although he states it may be due to pain.  No bowel or bladder incontinence.  No recent trauma.  I have reviewed and updated the following as appropriate: allergies and current medications SHx: former smoker  Review of Systems See HPI    Objective:   Physical Exam BP 153/84  Pulse 66  Temp(Src) 98.6 F (37 C) (Oral)  Ht 5\' 10"  (1.778 m)  Wt 279 lb (126.554 kg)  BMI 40.03 kg/m2 Gen: alert, cooperative, NAD Back: no erythema or obvious abnormalities; no point tenderness; no muscle spasm appreciated Neuro: 5/5 in hip flexion, quads, hamstrings, dorsiflexion, 5-/5 plantar flexion on the left; decreased sensation on the lateral ankle; patellar reflexes are 1+ and symmetric      Assessment & Plan:

## 2013-09-04 NOTE — Assessment & Plan Note (Addendum)
Likely lumbar strain, some concern for disc pathology given radicular symptoms. No vertebral body tenderness. Will check lumbar films today. Conservative measures with exercises, aleve. Will complete steroid pack from ER. Norco refilled. Return precautions reviewed as in AVS. Work note provided. Follow up in 2 weeks.  May need to consider MRI if worsening.

## 2013-09-06 ENCOUNTER — Inpatient Hospital Stay: Payer: PRIVATE HEALTH INSURANCE | Admitting: Emergency Medicine

## 2013-09-10 ENCOUNTER — Telehealth: Payer: Self-pay | Admitting: Emergency Medicine

## 2013-09-10 NOTE — Telephone Encounter (Signed)
Paperwork put in Dr.Honig's box. Lorenda Hatchet, Renato Battles

## 2013-09-10 NOTE — Telephone Encounter (Signed)
Pt dropped off paperwork to be filled out regarding FMLA pt stated that he would like for paperwork to be faxed to [phone] and has to be sent in before the end of the month.

## 2013-09-11 NOTE — Telephone Encounter (Signed)
FMLA forms completed and faxed to work place.  A copy was also placed up front for the patient to pick up and keep in his records.

## 2013-09-11 NOTE — Telephone Encounter (Signed)
Pt notified.  Molli Gethers L, CMA  

## 2013-09-18 ENCOUNTER — Telehealth: Payer: Self-pay | Admitting: Emergency Medicine

## 2013-09-18 NOTE — Telephone Encounter (Signed)
Pt brought in forms to be completed for disabitilty Please fax forms when completed

## 2013-09-19 NOTE — Telephone Encounter (Signed)
Form completed and placed in to be faxed box.  Please also put copies up front for the patient to pick up and keep for his records.

## 2013-09-19 NOTE — Telephone Encounter (Signed)
Will place forms in Dr. Earlean Polka box.  Also, FYI that patient has hospital follow up appt on 11/4/204 Radene Ou, CMA

## 2013-09-20 NOTE — Telephone Encounter (Signed)
Message given to patient.  Nguyen Todorov L, CMA  

## 2013-09-23 ENCOUNTER — Telehealth: Payer: Self-pay | Admitting: Emergency Medicine

## 2013-09-23 NOTE — Telephone Encounter (Signed)
Pt called because he needs a refill on his pain medication. He has an appointment 11/4, but needs it now. JW

## 2013-09-23 NOTE — Telephone Encounter (Signed)
Called pt and informed. Pt agreed. .Carlos Knight  

## 2013-09-23 NOTE — Telephone Encounter (Signed)
I will need to see him and re-evaluate need for narcotic medications before he will can get a refill.  The norco really should not be a long term medication.

## 2013-09-24 ENCOUNTER — Ambulatory Visit (INDEPENDENT_AMBULATORY_CARE_PROVIDER_SITE_OTHER): Payer: PRIVATE HEALTH INSURANCE | Admitting: Emergency Medicine

## 2013-09-24 ENCOUNTER — Encounter: Payer: Self-pay | Admitting: Emergency Medicine

## 2013-09-24 VITALS — BP 160/81 | HR 60 | Ht 70.0 in | Wt 283.0 lb

## 2013-09-24 DIAGNOSIS — M5416 Radiculopathy, lumbar region: Secondary | ICD-10-CM

## 2013-09-24 DIAGNOSIS — I10 Essential (primary) hypertension: Secondary | ICD-10-CM

## 2013-09-24 DIAGNOSIS — IMO0002 Reserved for concepts with insufficient information to code with codable children: Secondary | ICD-10-CM

## 2013-09-24 MED ORDER — HYDROCHLOROTHIAZIDE 25 MG PO TABS: 25.0000 mg | ORAL_TABLET | Freq: Every day | ORAL | Status: DC

## 2013-09-24 MED ORDER — HYDROCODONE-ACETAMINOPHEN 5-325 MG PO TABS: 1.0000 | ORAL_TABLET | Freq: Four times a day (QID) | ORAL | Status: DC | PRN

## 2013-09-24 NOTE — Progress Notes (Signed)
  Subjective:    Patient ID: Carlos Knight, male    DOB: 11/23/1955, 57 y.o.   MRN: [id number]  HPI Carlos Knight is here for f/u of back pain.  He reports that his back and left leg pain is slowly improving.  He still has pain in the left lower back, but it is improved - he can now tie his shoes.  Still getting shooting pains down the outside of the left leg and some numbness on the lateral left leg, but these are also improving.  He is amendable to physical therapy.  I have reviewed and updated the following as appropriate: allergies and current medications SHx: former smoker  Review of Systems See HPI    Objective:   Physical Exam BP 160/81  Pulse 60  Ht 5\' 10"  (1.778 m)  Wt 283 lb (128.368 kg)  BMI 40.61 kg/m2 Gen: alert, cooperative, NAD Neuro: 5/5 in bilateral plantar and dorsi-flexion; 2+ achilles reflexes bilaterally; unable to elicit either patellar reflex today     Assessment & Plan:

## 2013-09-24 NOTE — Assessment & Plan Note (Signed)
Slowly improving. Will refer to PT. Refilled norco #45.  Anticipate that we will stop this medication in the next 2-4 weeks. Note provided for work. Follow up in 2 weeks.

## 2013-09-24 NOTE — Patient Instructions (Signed)
It was nice to see you! I'm glad your back is slowly getting better.  I put in a referral to physical therapy.  You should hear from them by the end of the week to schedule an appointment. I have given you another prescription for the hydrocodone.  I anticipate being able to stop this soon.  Follow up in 2 weeks.

## 2013-09-24 NOTE — Assessment & Plan Note (Signed)
Elevated the last 2 appts. Used to be on a medication, thinks HCTZ. Will start HCTZ 25mg  daily. F/u in 2 weeks.

## 2013-09-26 ENCOUNTER — Other Ambulatory Visit: Payer: Self-pay

## 2013-10-03 ENCOUNTER — Ambulatory Visit: Payer: PRIVATE HEALTH INSURANCE | Attending: Emergency Medicine | Admitting: Physical Therapy

## 2013-10-03 DIAGNOSIS — M545 Low back pain, unspecified: Secondary | ICD-10-CM | POA: Insufficient documentation

## 2013-10-03 DIAGNOSIS — IMO0001 Reserved for inherently not codable concepts without codable children: Secondary | ICD-10-CM | POA: Insufficient documentation

## 2013-10-09 ENCOUNTER — Ambulatory Visit: Payer: PRIVATE HEALTH INSURANCE | Admitting: Physical Therapy

## 2013-10-09 ENCOUNTER — Ambulatory Visit (INDEPENDENT_AMBULATORY_CARE_PROVIDER_SITE_OTHER): Payer: PRIVATE HEALTH INSURANCE | Admitting: Emergency Medicine

## 2013-10-09 VITALS — BP 138/64 | HR 79 | Ht 70.0 in | Wt 282.0 lb

## 2013-10-09 DIAGNOSIS — M5416 Radiculopathy, lumbar region: Secondary | ICD-10-CM

## 2013-10-09 DIAGNOSIS — IMO0002 Reserved for concepts with insufficient information to code with codable children: Secondary | ICD-10-CM

## 2013-10-09 DIAGNOSIS — I10 Essential (primary) hypertension: Secondary | ICD-10-CM

## 2013-10-09 NOTE — Patient Instructions (Signed)
It was nice to see you!  Take the bayer back and body 2-3 times a day as needed. Continue with the physical therapy.  Your blood pressure is better with the medicine.  Please call Dr. Gerilyn Pilgrim, the nutritionist, to set up an appointment.  Follow up in 2 weeks.

## 2013-10-09 NOTE — Progress Notes (Signed)
  Subjective:    Patient ID: Carlos Knight, male    DOB: 1956-02-02, 57 y.o.   MRN: [id number]  HPI Carlos Knight is here for f/u back pain.  Back pain He has chronic low back pain that flared up about 4-6 weeks ago.  He had left lumbar back pain with radicular pain/numbness to left leg.  He started PT last week.  He reports feeling 75% better.  Still with occasional numbness or burning in the left leg if he overdoes it.  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    Hypertension Compliant with medication: yes Side effects from medication: no Check BP at home: no  Chest pain: no Palpitations: no Vision changes: no Leg edema: no Dizziness: no  I have reviewed and updated the following as appropriate: allergies and current medications SHx: former smoker  Review of Systems See HPI    Objective:   Physical Exam BP 138/64  Pulse 79  Ht 5\' 10"  (1.778 m)  Wt 282 lb (127.914 kg)  BMI 40.46 kg/m2 Gen: alert, cooperative, NAD      Assessment & Plan:

## 2013-10-09 NOTE — Assessment & Plan Note (Signed)
75% improved. Will continue with PT. OTC bayer back and body TID prn. Follow up in 2 weeks - will hopefully be able to return to work at that time.

## 2013-10-09 NOTE — Assessment & Plan Note (Addendum)
Much improved on HCTZ. Continue HCTZ 25mg  daily. Will get BMP at f/u.

## 2013-10-10 ENCOUNTER — Ambulatory Visit: Payer: PRIVATE HEALTH INSURANCE | Admitting: Physical Therapy

## 2013-10-14 ENCOUNTER — Encounter: Payer: PRIVATE HEALTH INSURANCE | Admitting: Physical Therapy

## 2013-10-16 ENCOUNTER — Encounter: Payer: PRIVATE HEALTH INSURANCE | Admitting: Physical Therapy

## 2013-10-21 ENCOUNTER — Ambulatory Visit: Payer: PRIVATE HEALTH INSURANCE | Admitting: Emergency Medicine

## 2014-02-21 ENCOUNTER — Other Ambulatory Visit: Payer: Self-pay | Admitting: Emergency Medicine

## 2014-04-23 ENCOUNTER — Encounter: Payer: Self-pay | Admitting: Emergency Medicine

## 2014-04-23 ENCOUNTER — Ambulatory Visit (INDEPENDENT_AMBULATORY_CARE_PROVIDER_SITE_OTHER): Payer: PRIVATE HEALTH INSURANCE | Admitting: Emergency Medicine

## 2014-04-23 VITALS — BP 166/94 | HR 58 | Temp 97.7°F | Ht 70.0 in | Wt 274.1 lb

## 2014-04-23 DIAGNOSIS — I10 Essential (primary) hypertension: Secondary | ICD-10-CM

## 2014-04-23 DIAGNOSIS — H8110 Benign paroxysmal vertigo, unspecified ear: Secondary | ICD-10-CM

## 2014-04-23 MED ORDER — MECLIZINE HCL 25 MG PO TABS: 25.0000 mg | ORAL_TABLET | Freq: Three times a day (TID) | ORAL | Status: DC | PRN

## 2014-04-23 MED ORDER — AMOXICILLIN 875 MG PO TABS: 875.0000 mg | ORAL_TABLET | Freq: Two times a day (BID) | ORAL | Status: DC

## 2014-04-23 MED ORDER — HYDROCHLOROTHIAZIDE 25 MG PO TABS: ORAL_TABLET | ORAL | Status: DC

## 2014-04-23 NOTE — Patient Instructions (Signed)
It was nice to see you!  Start taking the HCTZ (blood pressure medicine) again.  We are doing antibiotics for the vertigo. I also sent in meclizine.  You take take 1 pill every 8 hours as needed to help with the vertigo. I also gave you a handout on the Epley Maneuver.  I would recommended you do this at home AFTER taking a meclizine.  Follow up if not improved after the antibiotics.

## 2014-04-23 NOTE — Progress Notes (Signed)
   Subjective:    Patient ID: Carlos Knight, male    DOB: 1956-07-11, 58 y.o.   MRN: [id number]  HPI Carlos Knight is here for vertigo.  He reports a bout a week of vertigo. It comes and goes, and is positional particularly with turning his head to the left. He has a long history of intermittent vertigo. It typically appears after an upper respiratory infection. The last episode was 6 or 7 years ago. He states he had a head cold last week. He continues to have some ear pressure and ear popping. It sounds like he has undergone the Epley maneuver in the past with good results, however it is difficult for him to tolerate.  He also needs a refill on his blood pressure medication. He has not been taking this for several months. A nurse at discharge took his blood pressure and it was 170s over 80s.  Current Outpatient Prescriptions on File Prior to Visit  Medication Sig Dispense Refill  . Aspirin-Caffeine (BAYER BACK & BODY PAIN EX ST) 500-32.5 MG TABS Take 2 tablets by mouth daily as needed (Pain).      Marland Kitchen EPINEPHrine (EPIPEN) 0.3 mg/0.3 mL DEVI Inject 0.3 mg into the muscle once. As directed for bee stings.  Store in cool compartment       . Multiple Vitamin (MULTIVITAMIN WITH MINERALS) TABS tablet Take 1 tablet by mouth daily.       No current facility-administered medications on file prior to visit.    I have reviewed and updated the following as appropriate: allergies and current medications SHx: former smoker   Review of Systems See HPI    Objective:   Physical Exam BP 166/94  Pulse 58  Temp(Src) 97.7 F (36.5 C) (Oral)  Ht 5\' 10"  (1.778 m)  Wt 274 lb 1.6 oz (124.331 kg)  BMI 39.33 kg/m2 Gen: alert, cooperative, NAD Ears: R TM with pus like fluid behind the ear drum; L TM with clear fluid     Assessment & Plan:

## 2014-04-23 NOTE — Assessment & Plan Note (Signed)
Elevated today, but has not taken his medication in several months. Restart HCTZ 25 mg daily. He is to followup in 3 months, or sooner if blood pressure at home is over 140/90.

## 2014-04-23 NOTE — Assessment & Plan Note (Signed)
Likely triggered by ear infection. Will treat your infection with amoxicillin 875 mg twice a day x10 days. Also provided meclizine to use every 8 hours as needed. Also provided handout on Apley maneuver. Recommended taking meclizine 30 minutes before doing this to help with toleration. Followup if no improvement after completing antibiotics.

## 2016-02-19 ENCOUNTER — Encounter (HOSPITAL_COMMUNITY): Payer: Self-pay | Admitting: Nurse Practitioner

## 2016-02-19 ENCOUNTER — Emergency Department (HOSPITAL_COMMUNITY)
Admission: EM | Admit: 2016-02-19 | Discharge: 2016-02-19 | Disposition: A | Discharge status: Home / Self Care | Payer: BLUE CROSS/BLUE SHIELD | Attending: Emergency Medicine | Admitting: Emergency Medicine

## 2016-02-19 ENCOUNTER — Emergency Department (HOSPITAL_COMMUNITY): Payer: BLUE CROSS/BLUE SHIELD

## 2016-02-19 DIAGNOSIS — Z87438 Personal history of other diseases of male genital organs: Secondary | ICD-10-CM | POA: Insufficient documentation

## 2016-02-19 DIAGNOSIS — Z7982 Long term (current) use of aspirin: Secondary | ICD-10-CM | POA: Diagnosis not present

## 2016-02-19 DIAGNOSIS — Z8719 Personal history of other diseases of the digestive system: Secondary | ICD-10-CM | POA: Diagnosis not present

## 2016-02-19 DIAGNOSIS — G8929 Other chronic pain: Secondary | ICD-10-CM | POA: Insufficient documentation

## 2016-02-19 DIAGNOSIS — Z792 Long term (current) use of antibiotics: Secondary | ICD-10-CM | POA: Insufficient documentation

## 2016-02-19 DIAGNOSIS — R630 Anorexia: Secondary | ICD-10-CM | POA: Insufficient documentation

## 2016-02-19 DIAGNOSIS — Z79899 Other long term (current) drug therapy: Secondary | ICD-10-CM | POA: Insufficient documentation

## 2016-02-19 DIAGNOSIS — R011 Cardiac murmur, unspecified: Secondary | ICD-10-CM | POA: Insufficient documentation

## 2016-02-19 DIAGNOSIS — Z87891 Personal history of nicotine dependence: Secondary | ICD-10-CM | POA: Insufficient documentation

## 2016-02-19 DIAGNOSIS — R1031 Right lower quadrant pain: Secondary | ICD-10-CM | POA: Diagnosis present

## 2016-02-19 DIAGNOSIS — E669 Obesity, unspecified: Secondary | ICD-10-CM | POA: Diagnosis not present

## 2016-02-19 DIAGNOSIS — I10 Essential (primary) hypertension: Secondary | ICD-10-CM | POA: Insufficient documentation

## 2016-02-19 DIAGNOSIS — N200 Calculus of kidney: Secondary | ICD-10-CM

## 2016-02-19 DIAGNOSIS — N132 Hydronephrosis with renal and ureteral calculous obstruction: Secondary | ICD-10-CM | POA: Insufficient documentation

## 2016-02-19 HISTORY — DX: Gastro-esophageal reflux disease without esophagitis: K21.9

## 2016-02-19 LAB — CBC
HEMATOCRIT: 41.6 % (ref 39.0–52.0)
Hemoglobin: 13.2 g/dL (ref 13.0–17.0)
MCH: 26 pg (ref 26.0–34.0)
MCHC: 31.7 g/dL (ref 30.0–36.0)
MCV: 82.1 fL (ref 78.0–100.0)
Platelets: 205 10*3/uL (ref 150–400)
RBC: 5.07 MIL/uL (ref 4.22–5.81)
RDW: 15.6 % — AB (ref 11.5–15.5)
WBC: 10.4 10*3/uL (ref 4.0–10.5)

## 2016-02-19 LAB — URINALYSIS, ROUTINE W REFLEX MICROSCOPIC
BILIRUBIN URINE: NEGATIVE
GLUCOSE, UA: NEGATIVE mg/dL
Ketones, ur: NEGATIVE mg/dL
Leukocytes, UA: NEGATIVE
Nitrite: NEGATIVE
PROTEIN: 30 mg/dL — AB
Specific Gravity, Urine: 1.02 (ref 1.005–1.030)
pH: 7 (ref 5.0–8.0)

## 2016-02-19 LAB — COMPREHENSIVE METABOLIC PANEL
ALBUMIN: 4.4 g/dL (ref 3.5–5.0)
ALT: 22 U/L (ref 17–63)
AST: 23 U/L (ref 15–41)
Alkaline Phosphatase: 42 U/L (ref 38–126)
Anion gap: 11 (ref 5–15)
BUN: 13 mg/dL (ref 6–20)
CHLORIDE: 107 mmol/L (ref 101–111)
CO2: 23 mmol/L (ref 22–32)
Calcium: 9.6 mg/dL (ref 8.9–10.3)
Creatinine, Ser: 1.01 mg/dL (ref 0.61–1.24)
GFR calc Af Amer: >60 mL/min (ref >60)
GFR calc non Af Amer: >60 mL/min (ref >60)
GLUCOSE: 125 mg/dL — AB (ref 65–99)
POTASSIUM: 4.4 mmol/L (ref 3.5–5.1)
SODIUM: 141 mmol/L (ref 135–145)
Total Bilirubin: 0.4 mg/dL (ref 0.3–1.2)
Total Protein: 7.4 g/dL (ref 6.5–8.1)

## 2016-02-19 LAB — URINE MICROSCOPIC-ADD ON

## 2016-02-19 LAB — LIPASE, BLOOD: LIPASE: 33 U/L (ref 11–51)

## 2016-02-19 MED ORDER — HYDROMORPHONE HCL 1 MG/ML IJ SOLN
1.0000 mg | Freq: Once | INTRAMUSCULAR | Status: AC
  Administered 2016-02-19: 1 mg via INTRAVENOUS
  Filled 2016-02-19: qty 1

## 2016-02-19 MED ORDER — TAMSULOSIN HCL 0.4 MG PO CAPS: 0.4000 mg | ORAL_CAPSULE | Freq: Every day | ORAL | Status: DC

## 2016-02-19 MED ORDER — HYDROCODONE-ACETAMINOPHEN 5-325 MG PO TABS
1.0000 | ORAL_TABLET | Freq: Once | ORAL | Status: AC
  Administered 2016-02-19: 1 via ORAL
  Filled 2016-02-19: qty 1

## 2016-02-19 MED ORDER — KETOROLAC TROMETHAMINE 15 MG/ML IJ SOLN
15.0000 mg | Freq: Once | INTRAMUSCULAR | Status: AC
Start: 2016-02-19 — End: 2016-02-19
  Administered 2016-02-19: 15 mg via INTRAVENOUS
  Filled 2016-02-19: qty 1

## 2016-02-19 MED ORDER — HYDROCODONE-ACETAMINOPHEN 5-325 MG PO TABS: 1.0000 | ORAL_TABLET | ORAL | Status: DC | PRN

## 2016-02-19 MED ORDER — ONDANSETRON HCL 4 MG PO TABS: 4.0000 mg | ORAL_TABLET | Freq: Four times a day (QID) | ORAL | Status: DC

## 2016-02-19 MED ORDER — ONDANSETRON HCL 4 MG/2ML IJ SOLN
4.0000 mg | Freq: Once | INTRAMUSCULAR | Status: AC
  Administered 2016-02-19: 4 mg via INTRAVENOUS
  Filled 2016-02-19: qty 2

## 2016-02-19 NOTE — ED Notes (Signed)
Phlebotomy at bedside.

## 2016-02-19 NOTE — ED Notes (Signed)
Registration at bedside.

## 2016-02-19 NOTE — ED Notes (Signed)
Patient able to ambulate independently  

## 2016-02-19 NOTE — ED Notes (Signed)
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 Patient has been going on routes to try to work but unable to stop vomiting. Denies nausea at present time. Pt started taking caffeine pills today and EMS noted initial BP 230/110- BP has come down since then with last one 175/93. Patient denies chest pain, dizziness, headache or unilateral weakness or paresthesias.

## 2016-02-19 NOTE — ED Notes (Signed)
Patient transported to CT 

## 2016-02-19 NOTE — ED Provider Notes (Addendum)
CSN: [id number]     Arrival date & time 02/19/16  1825 History   First MD Initiated Contact with Patient 02/19/16 1840     Chief Complaint  Patient presents with  . Abdominal Pain     (Consider location/radiation/quality/duration/timing/severity/associated sxs/prior Treatment) Patient is a [age] y.o. male presenting with abdominal pain. The history is provided by the patient.  Abdominal Pain Pain location:  R flank and RLQ Pain quality: sharp, shooting and stabbing   Pain radiates to:  R flank Pain severity:  Severe Onset quality:  Gradual Duration:  12 hours Timing:  Constant Progression:  Waxing and waning Chronicity:  New Context comment:  Patient states that when he woke up this morning he had some pain in his right side which became severely worse when he was at work causing nausea, vomiting, diaphoresis Relieved by:  Nothing Worsened by:  Nothing tried Ineffective treatments:  None tried Associated symptoms: anorexia, nausea and vomiting   Associated symptoms: no chest pain, no chills, no constipation, no diarrhea, no dysuria, no fever, no hematuria and no shortness of breath   Risk factors: obesity   Risk factors: no alcohol abuse, has not had multiple surgeries and no recent hospitalization     Past Medical History  Diagnosis Date  . Hypertension   . Hyperlipidemia   . Pre-diabetes   . Cardiac murmur     since child hood takes prophylactic antibiotics before dental procedures  . Back pain, chronic   . Impotence     previously on viagra  . GERD (gastroesophageal reflux disease)    Past Surgical History  Procedure Laterality Date  . Tonsillectomy  1996    adenoids also removed   Family History  Problem Relation Age of Onset  . Stroke Mother   . Asthma Brother   . Cancer Paternal Grandmother    Social History  Substance Use Topics  . Smoking status: Former Games developermoker  . Smokeless tobacco: None  . Alcohol Use: No    Review of Systems  Constitutional:  Negative for fever and chills.  Respiratory: Negative for shortness of breath.   Cardiovascular: Negative for chest pain.  Gastrointestinal: Positive for nausea, vomiting, abdominal pain and anorexia. Negative for diarrhea and constipation.  Genitourinary: Negative for dysuria and hematuria.  All other systems reviewed and are negative.     Allergies  Bee venom  Home Medications   Prior to Admission medications   Medication Sig Start Date End Date Taking? Authorizing Provider  amoxicillin (AMOXIL) 875 MG tablet Take 1 tablet (875 mg total) by mouth 2 (two) times daily. 04/23/14   Charm RingsErin J Honig, MD  Aspirin-Caffeine (BAYER BACK & BODY PAIN EX ST) 500-32.5 MG TABS Take 2 tablets by mouth daily as needed (Pain).    Historical Provider, MD  EPINEPHrine (EPIPEN) 0.3 mg/0.3 mL DEVI Inject 0.3 mg into the muscle once. As directed for bee stings.  Store in Dance movement psychotherapistcool compartment     Historical Provider, MD  hydrochlorothiazide (HYDRODIURIL) 25 MG tablet TAKE 1 TABLET BY MOUTH DAILY 04/23/14   Charm RingsErin J Honig, MD  meclizine (ANTIVERT) 25 MG tablet Take 1 tablet (25 mg total) by mouth 3 (three) times daily as needed for dizziness. 04/23/14   Charm RingsErin J Honig, MD  Multiple Vitamin (MULTIVITAMIN WITH MINERALS) TABS tablet Take 1 tablet by mouth daily.    Historical Provider, MD   BP 162/69 mmHg  Pulse 57  Temp(Src) 99 F (37.2 C) (Oral)  Resp 18  Ht 5' 8.5" (1.74  m)  Wt 280 lb (127.007 kg)  BMI 41.95 kg/m2  SpO2 97% Physical Exam  Constitutional: He is oriented to person, place, and time. He appears well-developed and well-nourished. He appears distressed.  Patient looks comfortable and appears to be in pain. Obese  HENT:  Head: Normocephalic and atraumatic.  Mouth/Throat: Oropharynx is clear and moist.  Eyes: Conjunctivae and EOM are normal. Pupils are equal, round, and reactive to light.  Neck: Normal range of motion. Neck supple.  Cardiovascular: Normal rate, regular rhythm and intact distal pulses.    No murmur heard. Pulmonary/Chest: Effort normal and breath sounds normal. No respiratory distress. He has no wheezes. He has no rales.  Abdominal: Soft. He exhibits no distension. There is no tenderness. There is no rebound, no guarding and no CVA tenderness.  No reproducible tenderness in the abdomen or flank  Musculoskeletal: Normal range of motion. He exhibits no edema or tenderness.  Neurological: He is alert and oriented to person, place, and time.  Skin: Skin is warm and dry. No rash noted. No erythema.  Psychiatric: He has a normal mood and affect. His behavior is normal.  Nursing note and vitals reviewed.   ED Course  Procedures (including critical care time) Labs Review Labs Reviewed  COMPREHENSIVE METABOLIC PANEL - Abnormal; Notable for the following:    Glucose, Bld 125 (*)    All other components within normal limits  CBC - Abnormal; Notable for the following:    RDW 15.6 (*)    All other components within normal limits  URINALYSIS, ROUTINE W REFLEX MICROSCOPIC (NOT AT [facility]) - Abnormal; Notable for the following:    APPearance CLOUDY (*)    Hgb urine dipstick SMALL (*)    Protein, ur 30 (*)    All other components within normal limits  URINE MICROSCOPIC-ADD ON - Abnormal; Notable for the following:    Squamous Epithelial / LPF 0-5 (*)    Bacteria, UA RARE (*)    All other components within normal limits  LIPASE, BLOOD    Imaging Review Ct Abdomen Pelvis Wo Contrast  02/19/2016  CLINICAL DATA:  Pt c/o right flank and RLQ abdominal pain with N / V onset today. H/O back pain EXAM: CT ABDOMEN AND PELVIS WITHOUT CONTRAST TECHNIQUE: Multidetector CT imaging of the abdomen and pelvis was performed following the standard protocol without IV contrast. COMPARISON:  None. FINDINGS: Lower chest: Nodularity along right-sided fissures, including 6 mm nodule on image 1/series 3. Left upper lobe subpleural pulmonary nodule measures 5 mm. Normal heart size without pericardial or  pleural effusion. A tiny hiatal hernia. Hepatobiliary: Normal liver. Normal gallbladder, without biliary ductal dilatation. Pancreas: Normal, without mass or ductal dilatation. Spleen: Normal in size, without focal abnormality. Adrenals/Urinary Tract: Normal left adrenal gland. A right adrenal Myelolipoma measures 2.2 cm. Punctate left renal collecting system calculi. Moderate right-sided hydroureteronephrosis. This continues to the level of two adjacent stones. The larger is positioned within the distal right ureter at 6 mm on image 73/series 2. An adjacent stone is positioned just cephalad to the right ureterovesicular junction on the same image. In combination, 2 stones measure 11 mm craniocaudal on coronal image 70. No bladder calculi. Stomach/Bowel: Suspicion of a gastric diverticulum, moderate size on image 19/series 2. Scattered colonic diverticula. Normal terminal ileum and appendix. Increased density in the jejunal mesenteric fat is nonspecific but could represent mesenteric panniculitis. Small bowel loops are normal in caliber. Vascular/Lymphatic: Aortic and branch vessel atherosclerosis. No abdominopelvic adenopathy. Reproductive: Mild prostatomegaly. Other:  No significant free fluid. Musculoskeletal: Degenerative disc disease at the L4-L5 level. IMPRESSION: 1. Moderate right-sided hydroureteronephrosis secondary to 2 adjacent (versus less likely 1 bilobed) distal right ureteric stones. 2. Left nephrolithiasis. 3. Right adrenal myelolipoma. 4. Small hiatal hernia with probable gastric diverticulum. 5. Pulmonary nodules measuring maximally 6 mm. Non-contrast chest CT at 3-6 months is recommended. If the nodules are stable at time of repeat CT, then future CT at 18-24 months (from today's scan) is considered optional for low-risk patients, but is recommended for high-risk patients. This recommendation follows the consensus statement: Guidelines for Management of Incidental Pulmonary Nodules Detected on CT  Images:From the Fleischner Society 2017; published online before print (10.1148/radiol.[id number]). Electronically Signed   By: Jeronimo Greaves M.D.   On: 02/19/2016 19:58   I have personally reviewed and evaluated these images and lab results as part of my medical decision-making.   EKG Interpretation None      MDM   Final diagnoses:  Kidney stone on right side  Hydronephrosis with urinary obstruction due to renal calculus    Pt with symptoms consistent with kidney stone.  Denies infectious sx, or GI symptoms.  Low concern for diverticulitis and history not suggestive of AAA.  No hx suggestive of GU source (discharge) or dysuria.  Will hydrate, treat pain and ensure no infection with UA, CBC, CMP and will get stone study to further eval.  8:15 PM Urine with evidence of hematuria. No signs of infection. Lipase, CMP and CBC all within normal limits. CT scan shows moderate right-sided hydroureteronephrosis secondary to to distal ureteral stones. On reevaluation patient's pain has resolved. Patient was given a strainer, pain and nausea control and follow-up with urology on Monday if still symptomatic.     Gwyneth Sprout, MD 02/19/16 2015  Gwyneth Sprout, MD 02/19/16 2019

## 2016-02-19 NOTE — Discharge Instructions (Signed)
Dietary Guidelines to Help Prevent Kidney Stones Your risk of kidney stones can be decreased by adjusting the foods you eat. The most important thing you can do is drink enough fluid. You should drink enough fluid to keep your urine clear or pale yellow. The following guidelines provide specific information for the type of kidney stone you have had. GUIDELINES ACCORDING TO TYPE OF KIDNEY STONE Calcium Oxalate Kidney Stones  Reduce the amount of salt you eat. Foods that have a lot of salt cause your body to release excess calcium into your urine. The excess calcium can combine with a substance called oxalate to form kidney stones.  Reduce the amount of animal protein you eat if the amount you eat is excessive. Animal protein causes your body to release excess calcium into your urine. Ask your dietitian how much protein from animal sources you should be eating.  Avoid foods that are high in oxalates. If you take vitamins, they should have less than 500 mg of vitamin C. Your body turns vitamin C into oxalates. You do not need to avoid fruits and vegetables high in vitamin C. Calcium Phosphate Kidney Stones  Reduce the amount of salt you eat to help prevent the release of excess calcium into your urine.  Reduce the amount of animal protein you eat if the amount you eat is excessive. Animal protein causes your body to release excess calcium into your urine. Ask your dietitian how much protein from animal sources you should be eating.  Get enough calcium from food or take a calcium supplement (ask your dietitian for recommendations). Food sources of calcium that do not increase your risk of kidney stones include:  Broccoli.  Dairy products, such as cheese and yogurt.  Pudding. Uric Acid Kidney Stones  Do not have more than 6 oz of animal protein per day. FOOD SOURCES Animal Protein Sources  Meat (all types).  Poultry.  Eggs.  Fish, seafood. Foods High in Salt  Salt seasonings.  Soy  sauce.  Teriyaki sauce.  Cured and processed meats.  Salted crackers and snack foods.  Fast food.  Canned soups and most canned foods. Foods High in Oxalates  Grains:  Amaranth.  Barley.  Grits.  Wheat germ.  Bran.  Buckwheat flour.  All bran cereals.  Pretzels.  Whole wheat bread.  Vegetables:  Beans (wax).  Beets and beet greens.  Collard greens.  Eggplant.  Escarole.  Leeks.  Okra.  Parsley.  Rutabagas.  Spinach.  Swiss chard.  Tomato paste.  Fried potatoes.  Sweet potatoes.  Fruits:  Red currants.  Figs.  Kiwi.  Rhubarb.  Meat and Other Protein Sources:  Beans (dried).  Soy burgers and other soybean products.  Miso.  Nuts (peanuts, almonds, pecans, cashews, hazelnuts).  Nut butters.  Sesame seeds and tahini (paste made of sesame seeds).  Poppy seeds.  Beverages:  Chocolate drink mixes.  Soy milk.  Instant iced tea.  Juices made from high-oxalate fruits or vegetables.  Other:  Carob.  Chocolate.  Fruitcake.  Marmalades.   This information is not intended to replace advice given to you by your health care provider. Make sure you discuss any questions you have with your health care provider.   Document Released: 03/04/2011 Document Revised: 11/12/2013 Document Reviewed: 10/04/2013 Elsevier Interactive Patient Education 2016 Elsevier Inc.  

## 2017-06-15 ENCOUNTER — Ambulatory Visit (INDEPENDENT_AMBULATORY_CARE_PROVIDER_SITE_OTHER): Payer: BLUE CROSS/BLUE SHIELD | Admitting: Internal Medicine

## 2017-06-15 ENCOUNTER — Ambulatory Visit (HOSPITAL_COMMUNITY)
Admission: RE | Admit: 2017-06-15 | Discharge: 2017-06-15 | Disposition: A | Discharge status: Home / Self Care | Payer: BLUE CROSS/BLUE SHIELD | Source: Ambulatory Visit | Attending: Family Medicine | Admitting: Family Medicine

## 2017-06-15 VITALS — BP 158/98 | HR 70 | Temp 98.1°F | Ht 69.0 in | Wt 297.0 lb

## 2017-06-15 DIAGNOSIS — R002 Palpitations: Secondary | ICD-10-CM | POA: Diagnosis not present

## 2017-06-15 DIAGNOSIS — R9431 Abnormal electrocardiogram [ECG] [EKG]: Secondary | ICD-10-CM | POA: Insufficient documentation

## 2017-06-15 DIAGNOSIS — I10 Essential (primary) hypertension: Secondary | ICD-10-CM | POA: Diagnosis not present

## 2017-06-15 DIAGNOSIS — R7309 Other abnormal glucose: Secondary | ICD-10-CM

## 2017-06-15 DIAGNOSIS — Z0000 Encounter for general adult medical examination without abnormal findings: Secondary | ICD-10-CM

## 2017-06-15 LAB — POCT GLYCOSYLATED HEMOGLOBIN (HGB A1C): Hemoglobin A1C: 6.2

## 2017-06-15 MED ORDER — HYDROCHLOROTHIAZIDE 25 MG PO TABS: ORAL_TABLET | ORAL | 0 refills | Status: DC

## 2017-06-15 NOTE — Patient Instructions (Addendum)
It was nice meeting you today [name]!  I have placed a referral to a cardiologist for you. You will be called with the date and time of this appointment. If you develop palpitations and shortness of breath that do not go away, or if you develop chest pain, please go to the nearest emergency room.   Please begin taking HCTZ 25 mg (one tablet) once a day for your high blood pressure. We will check it again at your next appointment.   I would like to see you back in about six weeks to see how you are doing.   If you have any questions or concerns, please feel free to call the clinic.   Be well,  Dr. Natale MilchLancaster

## 2017-06-15 NOTE — Progress Notes (Signed)
61 y.o. year old male presents to establish care.  Acute Concerns: SOB/palpitations Patient reporting episode of SOB and palpitations for the past two months. Was initially concerned this may be due to high blood pressure, so has been checking his blood pressure daily at home with a wrist cuff. Highest BP at home has been 145/90. Was previously taking HCTZ but has not taken for well over a year. Reports symptoms are occurring at least once daily. 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 Thinks symptoms may be due to anxiety. Symptoms usually last for about 10 minutes then resolve spontaneously. Has tried deep breathing which helps some. Does occasionally have similar episodes when sitting alone at home at night relaxing prior to going to bed. Endorses occasional "wooziness" when standing up. Denies chest pain associated with the palpitations. Symptoms do not occur with exertion. Denies HA, changes in vision. Says symptoms still occur at least once daily but are not as bad as at onset two months ago. Reports a remote history of heart murmur but no other heart issues.   Diet: Typically only eats one meal per day but says he is still gaining weight. Doesn't eat breakfast because he doesn't like breakfast foods. Doesn't eat lunch because he says he doesn't have time. Sometimes eats snacks during the day, such as an apple or a bag of chips. Drinks mostly water. Cut sodas out of his diet a few years ago.   Exercise: Just joined a gym but hasn't gone yet.   Sexual History: Sexually active with his wife.   Social:  Smoked 1ppd for 18 years. Drinks one glass of wine twice per week.  Social History   Social History  . Marital status: Married    Spouse name: N/A  . Number of children: N/A  . Years of education: N/A   Social History Main Topics  . Smoking status: Former Games developermoker  . Smokeless tobacco: Not on file  .  Alcohol use No  . Drug use: No  . Sexual activity: Not on file   Other Topics Concern  . Not on file   Social History Narrative  . No narrative on file    Immunization: Immunization History  Administered Date(s) Administered  . Influenza Split 09/13/2015  . Td 03/22/1999    Cancer Screening:  Colonoscopy: Over 10 years ago. Interested in getting one.   Prostate Exam: Not up to date    Physical Exam: VITALS: Reviewed GEN: Pleasant male, NAD HEENT: Normocephalic, PERRL, EOMI, no scleral icterus, bilateral TM pearly grey, nasal septum midline, MMM, uvula midline, no anterior or posterior lymphadenopathy, no thyromegaly CARDIAC:RRR, S1 and S2 present, no murmur, no heaves/thrills RESP: CTAB, normal effort ABD: soft, no tenderness, normal bowel sounds EXT: No edema, 2+ radial and DP pulses SKIN: no rash  ASSESSMENT & PLAN: 61 y.o. male presents for annual well male/preventative exam. Please see problem specific assessment and plan.   Essential hypertension Initially elevated, then elevated again upon recheck ~20 minutes later. Not currently taking medications. Denies HA, changes in vision. Given elevated BP x2, will resume HCTZ.  - Resume HCTZ 25mg  qd - F/u in 6 weeks   Palpitations With accompanying SOB. Possibly anxiety related, as patient stating he thinks may be associated with anxiety and seems to occur mostly in anxiety-provoking situations, however does also occur when he is at rest at his house. Does not occur with  exertion and no accompanying chest pain. Cardiac issue such as SVT also high on differential. EKG performed in office which showed NSR. Patient not currently experiencing symptoms and no abnormalities on cardiac or pulm exam in office. Stable for now, and will refer to cardiology for further work-up.  - Ambulatory referral to cardiology  Tarri AbernethyAbigail J Lancaster, MD, MPH PGY-3 [facility] Pager [phone]

## 2017-06-16 DIAGNOSIS — R002 Palpitations: Secondary | ICD-10-CM | POA: Insufficient documentation

## 2017-06-16 LAB — CBC
HEMATOCRIT: 42 % (ref 37.5–51.0)
Hemoglobin: 13.7 g/dL (ref 13.0–17.7)
MCH: 26.8 pg (ref 26.6–33.0)
MCHC: 32.6 g/dL (ref 31.5–35.7)
MCV: 82 fL (ref 79–97)
PLATELETS: 204 10*3/uL (ref 150–379)
RBC: 5.12 x10E6/uL (ref 4.14–5.80)
RDW: 16.3 % — AB (ref 12.3–15.4)
WBC: 6.8 10*3/uL (ref 3.4–10.8)

## 2017-06-16 LAB — COMPREHENSIVE METABOLIC PANEL
A/G RATIO: 1.8 (ref 1.2–2.2)
ALT: 27 IU/L (ref 0–44)
AST: 23 IU/L (ref 0–40)
Albumin: 4.6 g/dL (ref 3.6–4.8)
Alkaline Phosphatase: 49 IU/L (ref 39–117)
BILIRUBIN TOTAL: 0.4 mg/dL (ref 0.0–1.2)
BUN/Creatinine Ratio: 11 (ref 10–24)
BUN: 11 mg/dL (ref 8–27)
CALCIUM: 9.6 mg/dL (ref 8.6–10.2)
CHLORIDE: 104 mmol/L (ref 96–106)
CO2: 25 mmol/L (ref 20–29)
Creatinine, Ser: 1 mg/dL (ref 0.76–1.27)
GFR calc Af Amer: 94 mL/min/{1.73_m2} (ref >59)
GFR, EST NON AFRICAN AMERICAN: 81 mL/min/{1.73_m2} (ref >59)
GLOBULIN, TOTAL: 2.5 g/dL (ref 1.5–4.5)
Glucose: 100 mg/dL — ABNORMAL HIGH (ref 65–99)
POTASSIUM: 4.4 mmol/L (ref 3.5–5.2)
SODIUM: 145 mmol/L — AB (ref 134–144)
Total Protein: 7.1 g/dL (ref 6.0–8.5)

## 2017-06-16 LAB — TSH: TSH: 2.56 u[IU]/mL (ref 0.450–4.500)

## 2017-06-16 NOTE — Assessment & Plan Note (Signed)
Initially elevated, then elevated again upon recheck ~20 minutes later. Not currently taking medications. Denies HA, changes in vision. Given elevated BP x2, will resume HCTZ.  - Resume HCTZ 25mg  qd - F/u in 6 weeks

## 2017-06-16 NOTE — Assessment & Plan Note (Signed)
With accompanying SOB. Possibly anxiety related, as patient stating he thinks may be associated with anxiety and seems to occur mostly in anxiety-provoking situations, however does also occur when he is at rest at his house. Does not occur with exertion and no accompanying chest pain. Cardiac issue such as SVT also high on differential. EKG performed in office which showed NSR. Patient not currently experiencing symptoms and no abnormalities on cardiac or pulm exam in office. Stable for now, and will refer to cardiology for further work-up.  - Ambulatory referral to cardiology

## 2017-07-20 ENCOUNTER — Encounter: Payer: Self-pay | Admitting: Internal Medicine

## 2017-07-27 ENCOUNTER — Ambulatory Visit (INDEPENDENT_AMBULATORY_CARE_PROVIDER_SITE_OTHER): Payer: BLUE CROSS/BLUE SHIELD | Admitting: Internal Medicine

## 2017-07-27 DIAGNOSIS — R002 Palpitations: Secondary | ICD-10-CM | POA: Diagnosis not present

## 2017-07-27 DIAGNOSIS — I10 Essential (primary) hypertension: Secondary | ICD-10-CM

## 2017-07-27 MED ORDER — HYDROCHLOROTHIAZIDE 50 MG PO TABS: ORAL_TABLET | ORAL | 0 refills | Status: DC

## 2017-07-27 NOTE — Assessment & Plan Note (Signed)
Now completely resolved, which is why patient did not schedule cardiology appt. Cardiac exam WNL today. Will continue to monitor at future appts.

## 2017-07-27 NOTE — Progress Notes (Signed)
   Subjective:   Patient: Carlos Knight       Birthdate: 10/20/1956       MRN: [id number]      HPI  Carlos Knight is a 61 y.o. male presenting for f/u of HTN and palpitations.   HTN Started on HCTZ 25mg  at last visit on 07/26 as BP had been elevated x2. Had previously been on HCTZ and responded well to it, so wanted to be put back on this medication. Returns today for f/u. Says he checks his BP at home some days and usually gets readings around 120s/80-90s. Checks BP in the afternoon after work after he has rested for a while. Denies any issues with HCTZ and takes it daily as prescribed. Denies vision changes, recurrent headaches.   Palpitations Patient says he has not had anymore palpitations of episodes of SOB since beginning HCTZ. He said 2-3 days after beginning the medication, he stopped having palpitations altogether. As such, he did not feel the need to schedule a cardiology appointment when contacted by their office. No chest pain. Feels very well today.   Smoking status reviewed. Patient is former smoker.   Review of Systems See HPI.     Objective:  Physical Exam  Constitutional: He is oriented to person, place, and time and well-developed, well-nourished, and in no distress.  HENT:  Head: Normocephalic and atraumatic.  Eyes: Conjunctivae and EOM are normal. Right eye exhibits no discharge. Left eye exhibits no discharge.  Cardiovascular: Normal rate, regular rhythm and normal heart sounds.   No murmur heard. Pulmonary/Chest: Effort normal and breath sounds normal. No respiratory distress. He has no wheezes.  Neurological: He is alert and oriented to person, place, and time.  Skin: Skin is warm and dry.  Psychiatric: Affect and judgment normal.      Assessment & Plan:  Essential hypertension BP elevated x2 again today, with initial BP 154/92 and repeat BP after resting with systolic of 152. As patient has now been on 25mg  HCTZ for about two months and BP is still  elevated, will increase to 50mg  HCTZ qd. Patient to f/u in six weeks. Can consider adding a second agent at that time if BP still not at goal.   Palpitations Now completely resolved, which is why patient did not schedule cardiology appt. Cardiac exam WNL today. Will continue to monitor at future appts.    Tarri AbernethyAbigail J Janyla Biscoe, MD, MPH PGY-3 [facility] Pager [phone]

## 2017-07-27 NOTE — Patient Instructions (Signed)
It was nice seeing you again today Carlos Knight!  Since your blood pressure was still high today, we will increase your dose of HCTZ to  per day. If you have any 25 mg tablets left, you can take two of those until you run out. Otherwise, you will take one 50 mg tablet.   I will see you back in about 6 weeks to see how your blood pressure is doing.   Decreasing the amount of salt in your diet can help with both blood pressure and weight loss. I have included information below on a low salt diet (the so-called "DASH" diet).   If you have any questions or concerns, please feel free to call the clinic.   Be well,  Dr. Natale Milch   DASH Eating Plan DASH stands for "Dietary Approaches to Stop Hypertension." The DASH eating plan is a healthy eating plan that has been shown to reduce high blood pressure (hypertension). It may also reduce your risk for type 2 diabetes, heart disease, and stroke. The DASH eating plan may also help with weight loss. What are tips for following this plan? General guidelines  Avoid eating more than 2,300 mg (milligrams) of salt (sodium) a day. If you have hypertension, you may need to reduce your sodium intake to 1,500 mg a day.  Limit alcohol intake to no more than 1 drink a day for nonpregnant women and 2 drinks a day for men. One drink equals 12 oz of beer, 5 oz of wine, or 1 oz of hard liquor.  Work with your health care provider to maintain a healthy body weight or to lose weight. Ask what an ideal weight is for you.  Get at least 30 minutes of exercise that causes your heart to beat faster (aerobic exercise) most days of the week. Activities may include walking, swimming, or biking.  Work with your health care provider or diet and nutrition specialist (dietitian) to adjust your eating plan to your individual calorie needs. Reading food labels  Check food labels for the amount of sodium per serving. Choose foods with less than 5 percent of the Daily Value of  sodium. Generally, foods with less than 300 mg of sodium per serving fit into this eating plan.  To find whole grains, look for the word "whole" as the first word in the ingredient list. Shopping  Buy products labeled as "low-sodium" or "no salt added."  Buy fresh foods. Avoid canned foods and premade or frozen meals. Cooking  Avoid adding salt when cooking. Use salt-free seasonings or herbs instead of table salt or sea salt. Check with your health care provider or pharmacist before using salt substitutes.  Do not fry foods. Cook foods using healthy methods such as baking, boiling, grilling, and broiling instead.  Cook with heart-healthy oils, such as olive, canola, soybean, or sunflower oil. Meal planning   Eat a balanced diet that includes: ? 5 or more servings of fruits and vegetables each day. At each meal, try to fill half of your plate with fruits and vegetables. ? Up to 6-8 servings of whole grains each day. ? Less than 6 oz of lean meat, poultry, or fish each day. A 3-oz serving of meat is about the same size as a deck of cards. One egg equals 1 oz. ? 2 servings of low-fat dairy each day. ? A serving of nuts, seeds, or beans 5 times each week. ? Heart-healthy fats. Healthy fats called Omega-3 fatty acids are found in foods such  as flaxseeds and coldwater fish, like sardines, salmon, and mackerel.  Limit how much you eat of the following: ? Canned or prepackaged foods. ? Food that is high in trans fat, such as fried foods. ? Food that is high in saturated fat, such as fatty meat. ? Sweets, desserts, sugary drinks, and other foods with added sugar. ? Full-fat dairy products.  Do not salt foods before eating.  Try to eat at least 2 vegetarian meals each week.  Eat more home-cooked food and less restaurant, buffet, and fast food.  When eating at a restaurant, ask that your food be prepared with less salt or no salt, if possible. What foods are recommended? The items  listed may not be a complete list. Talk with your dietitian about what dietary choices are best for you. Grains Whole-grain or whole-wheat bread. Whole-grain or whole-wheat pasta. Brown rice. Orpah Cobb. Bulgur. Whole-grain and low-sodium cereals. Pita bread. Low-fat, low-sodium crackers. Whole-wheat flour tortillas. Vegetables Fresh or frozen vegetables (raw, steamed, roasted, or grilled). Low-sodium or reduced-sodium tomato and vegetable juice. Low-sodium or reduced-sodium tomato sauce and tomato paste. Low-sodium or reduced-sodium canned vegetables. Fruits All fresh, dried, or frozen fruit. Canned fruit in natural juice (without added sugar). Meat and other protein foods Skinless chicken or [place]. Ground chicken or [place]. Pork with fat trimmed off. Fish and seafood. Egg whites. Dried beans, peas, or lentils. Unsalted nuts, nut butters, and seeds. Unsalted canned beans. Lean cuts of beef with fat trimmed off. Low-sodium, lean deli meat. Dairy Low-fat (1%) or fat-free (skim) milk. Fat-free, low-fat, or reduced-fat cheeses. Nonfat, low-sodium ricotta or cottage cheese. Low-fat or nonfat yogurt. Low-fat, low-sodium cheese. Fats and oils Soft margarine without trans fats. Vegetable oil. Low-fat, reduced-fat, or light mayonnaise and salad dressings (reduced-sodium). Canola, safflower, olive, soybean, and sunflower oils. Avocado. Seasoning and other foods Herbs. Spices. Seasoning mixes without salt. Unsalted popcorn and pretzels. Fat-free sweets. What foods are not recommended? The items listed may not be a complete list. Talk with your dietitian about what dietary choices are best for you. Grains Baked goods made with fat, such as croissants, muffins, or some breads. Dry pasta or rice meal packs. Vegetables Creamed or fried vegetables. Vegetables in a cheese sauce. Regular canned vegetables (not low-sodium or reduced-sodium). Regular canned tomato sauce and paste (not low-sodium or  reduced-sodium). Regular tomato and vegetable juice (not low-sodium or reduced-sodium). Rosita Fire. Olives. Fruits Canned fruit in a light or heavy syrup. Fried fruit. Fruit in cream or butter sauce. Meat and other protein foods Fatty cuts of meat. Ribs. Fried meat. Tomasa Blase. Sausage. Bologna and other processed lunch meats. Salami. Fatback. Hotdogs. Bratwurst. Salted nuts and seeds. Canned beans with added salt. Canned or smoked fish. Whole eggs or egg yolks. Chicken or [place] with skin. Dairy Whole or 2% milk, cream, and half-and-half. Whole or full-fat cream cheese. Whole-fat or sweetened yogurt. Full-fat cheese. Nondairy creamers. Whipped toppings. Processed cheese and cheese spreads. Fats and oils Butter. Stick margarine. Lard. Shortening. Ghee. Bacon fat. Tropical oils, such as coconut, palm kernel, or palm oil. Seasoning and other foods Salted popcorn and pretzels. Onion salt, garlic salt, seasoned salt, table salt, and sea salt. Worcestershire sauce. Tartar sauce. Barbecue sauce. Teriyaki sauce. Soy sauce, including reduced-sodium. Steak sauce. Canned and packaged gravies. Fish sauce. Oyster sauce. Cocktail sauce. Horseradish that you find on the shelf. Ketchup. Mustard. Meat flavorings and tenderizers. Bouillon cubes. Hot sauce and Tabasco sauce. Premade or packaged marinades. Premade or packaged taco seasonings. Relishes. Regular salad dressings.  Where to find more information:  National Heart, Lung, and Blood Institute: PopSteam.iswww.nhlbi.nih.gov  American Heart Association: www.heart.org Summary  The DASH eating plan is a healthy eating plan that has been shown to reduce high blood pressure (hypertension). It may also reduce your risk for type 2 diabetes, heart disease, and stroke.  With the DASH eating plan, you should limit salt (sodium) intake to 2,300 mg a day. If you have hypertension, you may need to reduce your sodium intake to 1,500 mg a day.  When on the DASH eating plan, aim to eat more  fresh fruits and vegetables, whole grains, lean proteins, low-fat dairy, and heart-healthy fats.  Work with your health care provider or diet and nutrition specialist (dietitian) to adjust your eating plan to your individual calorie needs. This information is not intended to replace advice given to you by your health care provider. Make sure you discuss any questions you have with your health care provider. Document Released: 10/27/2011 Document Revised: 10/31/2016 Document Reviewed: 10/31/2016 Elsevier Interactive Patient Education  2017 [employer]

## 2017-07-27 NOTE — Assessment & Plan Note (Signed)
BP elevated x2 again today, with initial BP 154/92 and repeat BP after resting with systolic of 152. As patient has now been on  HCTZ for about two months and BP is still elevated, will increase to  HCTZ qd. Patient to f/u in six weeks. Can consider adding a second agent at that time if BP still not at goal.

## 2017-08-21 ENCOUNTER — Ambulatory Visit (INDEPENDENT_AMBULATORY_CARE_PROVIDER_SITE_OTHER): Payer: BLUE CROSS/BLUE SHIELD | Admitting: Family Medicine

## 2017-08-21 ENCOUNTER — Encounter: Payer: Self-pay | Admitting: Family Medicine

## 2017-08-21 VITALS — BP 140/80 | HR 69 | Temp 97.9°F | Ht 70.0 in | Wt 302.0 lb

## 2017-08-21 DIAGNOSIS — M25562 Pain in left knee: Secondary | ICD-10-CM

## 2017-08-21 NOTE — Patient Instructions (Addendum)
It was good to see you today!  For your knee pain,  - stretching and strengthening muscles around your knee will help - take NSAIDS (ibuprofen or aleve, every 6 hours) to bring down the inflammation from overuse - Ice pack for relief from pain will help   Sign up for My Chart to have easy access to your labs results, and communication with your primary care physician.  Feel free to call with any questions or concerns at any time, at [phone].   Take care,  Dr. Leland Her, DO [facility]   Patellofemoral Pain Syndrome Patellofemoral pain syndrome is a condition that involves a softening or breakdown of the tissue (cartilage) on the underside of your kneecap (patella). This causes pain in the front of the knee. The condition is also called runner's knee or chondromalacia patella. Patellofemoral pain syndrome is most common in young adults who are active in sports. Your knee is the largest joint in your body. The patella covers the front of your knee and is attached to muscles above and below your knee. The underside of the patella is covered with a smooth type of cartilage (synovium). The smooth surface helps the patella glide easily when you move your knee. Patellofemoral pain syndrome causes swelling in the joint linings and bone surfaces in your knee. What are the causes? Patellofemoral pain syndrome can be caused by:  Overuse.  Poor alignment of your knee joints.  Weak leg muscles.  A direct blow to your kneecap.  What increases the risk? You may be at risk for patellofemoral pain syndrome if you:  Do a lot of activities that can wear down your kneecap. These include: ? Running. ? Squatting. ? Climbing stairs.  Start a new physical activity or exercise program.  Wear shoes that do not fit well.  Do not have good leg strength.  Are overweight.  What are the signs or symptoms? Knee pain is the most common symptom of patellofemoral pain syndrome.  This may feel like a dull, aching pain underneath your patella, in the front of your knee. There may be a popping or cracking sound when you move your knee. Pain may get worse with:  Exercise.  Climbing stairs.  Running.  Jumping.  Squatting.  Kneeling.  Sitting for a long time.  Moving or pushing on your patella.  How is this diagnosed? Your health care provider may be able to diagnose patellofemoral pain syndrome from your symptoms and medical history. You may be asked about your recent physical activities and which ones cause knee pain. Your health care provider may do a physical exam with certain tests to confirm the diagnosis. These may include:  Moving your patella back and forth.  Checking your range of knee motion.  Having you squat or jump to see if you have pain.  Checking the strength of your leg muscles.  An MRI of the knee may also be done. How is this treated? Patellofemoral pain syndrome can usually be treated at home with rest, ice, compression, and elevation (RICE). Other treatments may include:  Nonsteroidal anti-inflammatory drugs (NSAIDs).  Physical therapy to stretch and strengthen your leg muscles.  Shoe inserts (orthotics) to take stress off your knee.  A knee brace or knee support.  Surgery to remove damaged cartilage or move the patella to a better position. The need for surgery is rare.  Follow these instructions at home:  Take medicines only as directed by your health care provider.  Rest  your knee. ? When resting, keep your knee raised above the level of your heart. ? Avoid activities that cause knee pain.  Apply ice to the injured area: ? Put ice in a plastic bag. ? Place a towel between your skin and the bag. ? Leave the ice on for 20 minutes, 2-3 times a day.  Use splints, braces, knee supports, or walking aids as directed by your health care provider.  Perform stretching and strengthening exercises as directed by your health  care provider or physical therapist.  Keep all follow-up visits as directed by your health care provider. This is important. Contact a health care provider if:  Your symptoms get worse.  You are not improving with home care. This information is not intended to replace advice given to you by your health care provider. Make sure you discuss any questions you have with your health care provider. Document Released: 10/26/2009 Document Revised: 04/14/2016 Document Reviewed: 01/27/2014 Elsevier Interactive Patient Education  2018 [employer].   Knee Exercises Ask your health care provider which exercises are safe for you. Do exercises exactly as told by your health care provider and adjust them as directed. It is normal to feel mild stretching, pulling, tightness, or discomfort as you do these exercises, but you should stop right away if you feel sudden pain or your pain gets worse.Do not begin these exercises until told by your health care provider. STRETCHING AND RANGE OF MOTION EXERCISES These exercises warm up your muscles and joints and improve the movement and flexibility of your knee. These exercises also help to relieve pain, numbness, and tingling. Exercise A: Knee Extension, Prone 1. Lie on your abdomen on a bed. 2. Place your left / right knee just beyond the edge of the surface so your knee is not on the bed. You can put a towel under your left / right thigh just above your knee for comfort. 3. Relax your leg muscles and allow gravity to straighten your knee. You should feel a stretch behind your left / right knee. 4. Hold this position for __________ seconds. 5. Scoot up so your knee is supported between repetitions. Repeat __________ times. Complete this stretch __________ times a day. Exercise B: Knee Flexion, Active  1. Lie on your back with both knees straight. If this causes back discomfort, bend your left / right knee so your foot is flat on the floor. 2. Slowly slide your  left / right heel back toward your buttocks until you feel a gentle stretch in the front of your knee or thigh. 3. Hold this position for __________ seconds. 4. Slowly slide your left / right heel back to the starting position. Repeat __________ times. Complete this exercise __________ times a day. Exercise C: Quadriceps, Prone  1. Lie on your abdomen on a firm surface, such as a bed or padded floor. 2. Bend your left / right knee and hold your ankle. If you cannot reach your ankle or pant leg, loop a belt around your foot and grab the belt instead. 3. Gently pull your heel toward your buttocks. Your knee should not slide out to the side. You should feel a stretch in the front of your thigh and knee. 4. Hold this position for __________ seconds. Repeat __________ times. Complete this stretch __________ times a day. Exercise D: Hamstring, Supine 1. Lie on your back. 2. Loop a belt or towel over the ball of your left / right foot. The ball of your foot is on  the walking surface, right under your toes. 3. Straighten your left / right knee and slowly pull on the belt to raise your leg until you feel a gentle stretch behind your knee. ? Do not let your left / right knee bend while you do this. ? Keep your other leg flat on the floor. 4. Hold this position for __________ seconds. Repeat __________ times. Complete this stretch __________ times a day. STRENGTHENING EXERCISES These exercises build strength and endurance in your knee. Endurance is the ability to use your muscles for a long time, even after they get tired. Exercise E: Quadriceps, Isometric  1. Lie on your back with your left / right leg extended and your other knee bent. Put a rolled towel or small pillow under your knee if told by your health care provider. 2. Slowly tense the muscles in the front of your left / right thigh. You should see your kneecap slide up toward your hip or see increased dimpling just above the knee. This motion  will push the back of the knee toward the floor. 3. For __________ seconds, keep the muscle as tight as you can without increasing your pain. 4. Relax the muscles slowly and completely. Repeat __________ times. Complete this exercise __________ times a day. Exercise F: Straight Leg Raises - Quadriceps 1. Lie on your back with your left / right leg extended and your other knee bent. 2. Tense the muscles in the front of your left / right thigh. You should see your kneecap slide up or see increased dimpling just above the knee. Your thigh may even shake a bit. 3. Keep these muscles tight as you raise your leg 4-6 inches (10-15 cm) off the floor. Do not let your knee bend. 4. Hold this position for __________ seconds. 5. Keep these muscles tense as you lower your leg. 6. Relax your muscles slowly and completely after each repetition. Repeat __________ times. Complete this exercise __________ times a day. Exercise G: Hamstring, Isometric 1. Lie on your back on a firm surface. 2. Bend your left / right knee approximately __________ degrees. 3. Dig your left / right heel into the surface as if you are trying to pull it toward your buttocks. Tighten the muscles in the back of your thighs to dig as hard as you can without increasing any pain. 4. Hold this position for __________ seconds. 5. Release the tension gradually and allow your muscles to relax completely for __________ seconds after each repetition. Repeat __________ times. Complete this exercise __________ times a day. Exercise H: Hamstring Curls  If told by your health care provider, do this exercise while wearing ankle weights. Begin with __________ weights. Then increase the weight by 1 lb (0.5 kg) increments. Do not wear ankle weights that are more than __________. 1. Lie on your abdomen with your legs straight. 2. Bend your left / right knee as far as you can without feeling pain. Keep your hips flat against the floor. 3. Hold this  position for __________ seconds. 4. Slowly lower your leg to the starting position.  Repeat __________ times. Complete this exercise __________ times a day. Exercise I: Squats (Quadriceps) 1. Stand in front of a table, with your feet and knees pointing straight ahead. You may rest your hands on the table for balance but not for support. 2. Slowly bend your knees and lower your hips like you are going to sit in a chair. ? Keep your weight over your heels, not over your toes. ?  Keep your lower legs upright so they are parallel with the table legs. ? Do not let your hips go lower than your knees. ? Do not bend lower than told by your health care provider. ? If your knee pain increases, do not bend as low. 3. Hold the squat position for __________ seconds. 4. Slowly push with your legs to return to standing. Do not use your hands to pull yourself to standing. Repeat __________ times. Complete this exercise __________ times a day. Exercise J: Wall Slides (Quadriceps)  1. Lean your back against a smooth wall or door while you walk your feet out 18-24 inches (46-61 cm) from it. 2. Place your feet hip-width apart. 3. Slowly slide down the wall or door until your knees bend __________ degrees. Keep your knees over your heels, not over your toes. Keep your knees in line with your hips. 4. Hold for __________ seconds. Repeat __________ times. Complete this exercise __________ times a day. Exercise K: Straight Leg Raises - Hip Abductors 1. Lie on your side with your left / right leg in the top position. Lie so your head, shoulder, knee, and hip line up. You may bend your bottom knee to help you keep your balance. 2. Roll your hips slightly forward so your hips are stacked directly over each other and your left / right knee is facing forward. 3. Leading with your heel, lift your top leg 4-6 inches (10-15 cm). You should feel the muscles in your outer hip lifting. ? Do not let your foot drift  forward. ? Do not let your knee roll toward the ceiling. 4. Hold this position for __________ seconds. 5. Slowly return your leg to the starting position. 6. Let your muscles relax completely after each repetition. Repeat __________ times. Complete this exercise __________ times a day. Exercise L: Straight Leg Raises - Hip Extensors 1. Lie on your abdomen on a firm surface. You can put a pillow under your hips if that is more comfortable. 2. Tense the muscles in your buttocks and lift your left / right leg about 4-6 inches (10-15 cm). Keep your knee straight as you lift your leg. 3. Hold this position for __________ seconds. 4. Slowly lower your leg to the starting position. 5. Let your leg relax completely after each repetition. Repeat __________ times. Complete this exercise __________ times a day. This information is not intended to replace advice given to you by your health care provider. Make sure you discuss any questions you have with your health care provider. Document Released: 09/21/2005 Document Revised: 08/01/2016 Document Reviewed: 09/13/2015 Elsevier Interactive Patient Education  2018 [employer].

## 2017-08-21 NOTE — Progress Notes (Signed)
    Subjective:  Carlos Knight is a 61 y.o. male who presents to the [facility] today with a chief complaint of L knee pain.   HPI: L knee pain - started after overuse on Thursday evening. Was helping brother move a car and was getting into and out of car frequently and pushing. States did not have any acute injury or trauma. No popping sensation. - Has since felt a dull aching pain across the top of his kneecap - exacerbated by going up or down steps, becomes "excruciating and sharp" and also with standing/bearing weight - has tried aleve at home which has helped  - no falls or leg/knee giving out. No numbness or tingling     ROS: Per HPI  Objective:  Physical Exam: BP 140/80 (BP Location: Left Arm, Patient Position: Sitting, Cuff Size: Large)   Pulse 69   Temp 97.9 F (36.6 C) (Oral)   Ht  (1.778 m)   Wt (!) 302 lb (137 kg)   SpO2 93%   BMI 43.33 kg/m   Gen: obese man, NAD, resting comfortably MSK: L knee without warmth or edema or erythema. TTP over patellar tendon. No crepitus with ROM. Neg ant/post drawer. Neg mcmurray.  Skin: warm, dry Neuro: grossly normal, moves all extremities Psych: Normal affect and thought content    Assessment/Plan:  Acute pain of left knee Patient has acute L knee pain is the setting of overuse with obesity and h/o arthritis. Most likely patellofemoral pain given pain is located around patella and exacerbation with steps. Discussed with patient that NSAIDs and exercises are mainstay of treatment so advised more regular use of aleve at home and given handout with stretches/exercises today.   Leland Her, DO PGY-2, [facility] 08/21/2017 9:32 AM

## 2017-08-22 ENCOUNTER — Telehealth: Payer: Self-pay | Admitting: Internal Medicine

## 2017-08-22 ENCOUNTER — Other Ambulatory Visit: Payer: Self-pay | Admitting: Family Medicine

## 2017-08-22 DIAGNOSIS — M25562 Pain in left knee: Secondary | ICD-10-CM | POA: Insufficient documentation

## 2017-08-22 MED ORDER — NAPROXEN 500 MG PO TABS: 500.0000 mg | ORAL_TABLET | Freq: Two times a day (BID) | ORAL | 0 refills | Status: AC

## 2017-08-22 NOTE — Telephone Encounter (Signed)
Pt saw dr Artist Pais yesterday for knee pain.  Dr suggested using Aleve. He would like to get something stronger.  Please advise

## 2017-08-22 NOTE — Assessment & Plan Note (Signed)
Patient has acute L knee pain is the setting of overuse with obesity and h/o arthritis. Most likely patellofemoral pain given pain is located around patella and exacerbation with steps. Discussed with patient that NSAIDs and exercises are mainstay of treatment so advised more regular use of aleve at home and given handout with stretches/exercises today.

## 2017-08-22 NOTE — Telephone Encounter (Signed)
Attempted to call patient back but no answer. Left message that I would send him in a stronger NSAID dose. Naproxen rx sent to patient pharmacy.

## 2017-09-07 ENCOUNTER — Ambulatory Visit (INDEPENDENT_AMBULATORY_CARE_PROVIDER_SITE_OTHER): Payer: BLUE CROSS/BLUE SHIELD | Admitting: Internal Medicine

## 2017-09-07 ENCOUNTER — Encounter: Payer: Self-pay | Admitting: Internal Medicine

## 2017-09-07 DIAGNOSIS — I10 Essential (primary) hypertension: Secondary | ICD-10-CM | POA: Diagnosis not present

## 2017-09-07 NOTE — Assessment & Plan Note (Signed)
Initially elevated at 168/80 upon arrival to clinic, however improved to systolic 130 upon recheck. Patient wishing to stay on current dose of HCTZ 50mg , and BP within goal upon recheck, so will continue this treatment regimen for now. Encouraged patient to begin writing down BP measurements. Will f/u in 6 weeks to make sure still at goal.

## 2017-09-07 NOTE — Progress Notes (Signed)
   Subjective:   Patient: Carlos Knight       Birthdate: 09/11/1956       MRN: [id number]      HPI  Carlos Knight is a 61 y.o. male presenting for BP f/u.   HTN Patient increased to HCTZ 50mg  at last visit ~6 wks ago. Since then has measured his BP at home intermittently. Typically gets numbers between 140-150/80-90. Says that initially he felt lightheaded occasionally but this has completely resolved now. Is urinating more but says it is not bothersome and he feels like he needs to be on a diuretic so he is okay with this. Denies recurrent HA, vision changes.   Smoking status reviewed. Patient is former smoker.   Review of Systems See HPI.     Objective:  Physical Exam  Constitutional: He is oriented to person, place, and time and well-developed, well-nourished, and in no distress.  HENT:  Head: Normocephalic and atraumatic.  Eyes: Pupils are equal, round, and reactive to light. Conjunctivae and EOM are normal. Right eye exhibits no discharge. Left eye exhibits no discharge.  Arcus senilis present bilaterally  Cardiovascular: Normal rate, regular rhythm and normal heart sounds.   No murmur heard. Pulmonary/Chest: Effort normal and breath sounds normal. No respiratory distress. He has no wheezes.  Neurological: He is alert and oriented to person, place, and time.  Skin: Skin is warm and dry.  Psychiatric: Affect and judgment normal.      Assessment & Plan:  Essential hypertension Initially elevated at 168/80 upon arrival to clinic, however improved to systolic 130 upon recheck. Patient wishing to stay on current dose of HCTZ 50mg , and BP within goal upon recheck, so will continue this treatment regimen for now. Encouraged patient to begin writing down BP measurements. Will f/u in 6 weeks to make sure still at goal.    Tarri AbernethyAbigail J Lancaster, MD, MPH PGY-3 [facility] Pager [phone]

## 2017-09-07 NOTE — Patient Instructions (Addendum)
It was nice seeing you again today [name]!  Since your blood pressure improved the second time we measured it, we can continue on the HCTZ 50mg  for now. Please try to measure your blood pressure once a day and write down the result. I would like to see you back in 6 weeks to make sure it continues to be at goal. Please be sure to bring your blood pressure recordings with you.   If you have any questions or concerns, please feel free to call the clinic.   Be well,  Dr. Natale MilchLancaster

## 2017-09-12 ENCOUNTER — Ambulatory Visit (INDEPENDENT_AMBULATORY_CARE_PROVIDER_SITE_OTHER): Payer: BLUE CROSS/BLUE SHIELD | Admitting: Family Medicine

## 2017-09-12 ENCOUNTER — Encounter: Payer: Self-pay | Admitting: Family Medicine

## 2017-09-12 ENCOUNTER — Other Ambulatory Visit: Payer: Self-pay | Admitting: Family Medicine

## 2017-09-12 VITALS — BP 128/68 | HR 92 | Temp 100.2°F | Ht 70.0 in | Wt 289.0 lb

## 2017-09-12 DIAGNOSIS — B349 Viral infection, unspecified: Secondary | ICD-10-CM

## 2017-09-12 MED ORDER — IBUPROFEN 400 MG PO TABS: 400.0000 mg | ORAL_TABLET | Freq: Four times a day (QID) | ORAL | 0 refills | Status: DC | PRN

## 2017-09-12 MED ORDER — BENZONATATE 100 MG PO CAPS: 100.0000 mg | ORAL_CAPSULE | Freq: Three times a day (TID) | ORAL | 0 refills | Status: AC | PRN

## 2017-09-12 NOTE — Progress Notes (Signed)
Subjective:     Patient ID: Carlos Knight, male   DOB: 03/07/1956, 61 y.o.   MRN: [id number]  Cough  This is a new problem. The current episode started in the past 7 days (Started 5 days ago. he started with fever and cough thursday night). The problem occurs every few minutes. The cough is non-productive. Associated symptoms include chills and a fever. Pertinent negatives include no nasal congestion, rash, sore throat, shortness of breath or wheezing. Nothing aggravates the symptoms. Risk factors: uncertain if he had sick contact at work or in church but not at home. Treatments tried: Aleeve. The treatment provided moderate relief.  Fever   This is a new problem. The current episode started in the past 7 days (Started 5 days ago associated with chills). The problem has been waxing and waning. His temperature was unmeasured prior to arrival. Associated symptoms include coughing. Pertinent negatives include no rash, sore throat or wheezing. He has tried NSAIDs for the symptoms. The treatment provided moderate relief.  Risk factors: no recent sickness, no recent travel and no sick contacts   Risk factors comment:  He feels this is related to stress from work and church activities. He is yet to get his flu shot.  Current Outpatient Prescriptions on File Prior to Visit  Medication Sig Dispense Refill  . Aspirin-Caffeine (BAYER BACK & BODY PAIN EX ST) 500-32.5 MG TABS Take 2 tablets by mouth daily as needed (Pain).    Marland Kitchen. EPINEPHrine (EPIPEN) 0.3 mg/0.3 mL DEVI Inject 0.3 mg into the muscle once. As directed for bee stings.  Store in cool compartment     . hydrochlorothiazide (HYDRODIURIL) 50 MG tablet TAKE 1 TABLET BY MOUTH DAILY 90 tablet 0  . Multiple Vitamin (MULTIVITAMIN WITH MINERALS) TABS tablet Take 1 tablet by mouth daily.    . naproxen (NAPROSYN) 500 MG tablet Take 1 tablet (500 mg total) by mouth 2 (two) times daily with a meal. 30 tablet 0   No current facility-administered medications on  file prior to visit.    Past Medical History:  Diagnosis Date  . Back pain, chronic   . Cardiac murmur    since child hood takes prophylactic antibiotics before dental procedures  . GERD (gastroesophageal reflux disease)   . Hyperlipidemia   . Hypertension   . Impotence    previously on viagra  . Pre-diabetes    Vitals:   09/12/17 0857  BP: 128/68  Pulse: 92  Temp: 100.2 F (37.9 C)  TempSrc: Oral  SpO2: 95%  Weight: 289 lb (131.1 kg)  Height: 5\' 10"  (1.778 m)      Review of Systems  Constitutional: Positive for chills, fatigue and fever.  HENT: Negative for sore throat.   Respiratory: Positive for cough. Negative for shortness of breath and wheezing.   Genitourinary: Negative.   Musculoskeletal:       Body ache  Skin: Negative for rash.  All other systems reviewed and are negative.      Objective:   Physical Exam  Constitutional: He is oriented to person, place, and time. He appears well-developed. No distress.  HENT:  Head: Normocephalic.  Right Ear: Tympanic membrane, external ear and ear canal normal.  Left Ear: Tympanic membrane, external ear and ear canal normal.  Mouth/Throat: Uvula is midline, oropharynx is clear and moist and mucous membranes are normal.  Cardiovascular: Normal rate, regular rhythm and normal heart sounds.   No murmur heard. Pulmonary/Chest: Effort normal and breath sounds normal. No respiratory distress.  He has no wheezes. He has no rhonchi. He has no rales.  Neurological: He is oriented to person, place, and time. Coordination normal.  Nursing note and vitals reviewed.      Assessment:     Viral syndrome: Cough with fever    Plan:     May be influenza infection. He has passed the 72 hour window for Tamiflu. Will treat conservatively with Ibuprofen and tessalon pearl for cough prn. Red flag signs discussed and I recommended ED visit if things get worse. He agreed with plan and verbalized understanding.

## 2017-09-12 NOTE — Patient Instructions (Signed)

## 2017-10-05 ENCOUNTER — Telehealth: Payer: Self-pay | Admitting: *Deleted

## 2017-10-05 NOTE — Telephone Encounter (Signed)
It appears patient has not been prescribed Viagra since 2012. He needs to be seen in office before this will be refilled.   Tarri AbernethyAbigail J Tyion Boylen, MD, MPH PGY-3 [facility] Pager [phone]

## 2017-10-05 NOTE — Telephone Encounter (Signed)
Pt informed, he says he has an appt with you on 11/28, and that he will wait until then. Keyonia Gluth Bruna PotterBlount, CMA

## 2017-10-05 NOTE — Telephone Encounter (Signed)
Pt calling in wanting a refill on his viagra. Deseree Bruna PotterBlount, CMA

## 2017-10-18 ENCOUNTER — Ambulatory Visit: Payer: BLUE CROSS/BLUE SHIELD | Admitting: Internal Medicine

## 2017-10-24 ENCOUNTER — Encounter: Payer: Self-pay | Admitting: Internal Medicine

## 2017-10-24 ENCOUNTER — Other Ambulatory Visit: Payer: Self-pay

## 2017-10-24 ENCOUNTER — Ambulatory Visit: Payer: BLUE CROSS/BLUE SHIELD | Admitting: Internal Medicine

## 2017-10-24 VITALS — BP 146/86 | HR 70 | Temp 98.1°F | Ht 70.0 in | Wt 290.4 lb

## 2017-10-24 DIAGNOSIS — R002 Palpitations: Secondary | ICD-10-CM

## 2017-10-24 DIAGNOSIS — Z1211 Encounter for screening for malignant neoplasm of colon: Secondary | ICD-10-CM

## 2017-10-24 DIAGNOSIS — Z0000 Encounter for general adult medical examination without abnormal findings: Secondary | ICD-10-CM | POA: Diagnosis not present

## 2017-10-24 DIAGNOSIS — I10 Essential (primary) hypertension: Secondary | ICD-10-CM

## 2017-10-24 MED ORDER — HYDROCHLOROTHIAZIDE 50 MG PO TABS: ORAL_TABLET | ORAL | 3 refills | Status: DC

## 2017-10-24 NOTE — Patient Instructions (Addendum)
It was nice seeing you again today [name]!  Please continue to take HCTZ 50mg  daily as you have been. You do not have to continue writing down your blood pressures if you do not want to, but I do encourage you to continue checking your blood pressure at home. If you notice that it is consistently higher than usual, please schedule an appointment. Otherwise, I will see you in about 3 months.   I have placed a referral for you for a colonoscopy. They will call you to set up this appointment.   If you have any questions or concerns, please feel free to call the clinic.   Be well,  Dr. Natale MilchLancaster

## 2017-10-24 NOTE — Assessment & Plan Note (Signed)
At goal today in office (146/86), with even better average measurements at home (110s/70s). Likely higher than home readings 2/2 white coat hypertension, as patient has taken HCTZ this AM. As patient is at goal and has no complaints about HCTZ, will continue at current dose of 50mg  qd. As patient has had BP at goal for past two visits, will space visits out to q3 months. Encouraged patient to continue checking BP at home and to schedule appt sooner if he notices persistently increased BP.  - Continue HCTZ 50mg  qd - F/u in 3 months

## 2017-10-24 NOTE — Progress Notes (Signed)
   Subjective:   Patient: Carlos Knight       Birthdate: 09/11/1956       MRN: [id number]      HPI  Carlos Poppererence A Guglielmo is a 61 y.o. male presenting for BP f/u.   HTN Last seen 09/07/17. Patient remained on HCTZ 50mg  per his wishes and as BP was at goal. He presents today for scheduled follow up.  Patient has been measuring BP at home daily, and has recordings between 102-130/59-86. Average 110s/70s. Denies headaches, changes in vision, lightheadedness. Has taken his meds today. Says that HCTZ is not causing him to urinate too frequently to the point that it is bothersome.   Smoking status reviewed. Patient is former smoker.   Review of Systems See HPI.     Objective:  Physical Exam  Constitutional: He is oriented to person, place, and time and well-developed, well-nourished, and in no distress.  HENT:  Head: Normocephalic and atraumatic.  Eyes: Conjunctivae and EOM are normal. Right eye exhibits no discharge. Left eye exhibits no discharge.  Pulmonary/Chest: Effort normal. No respiratory distress.  Neurological: He is alert and oriented to person, place, and time.  Skin: Skin is warm and dry.  Psychiatric: Affect and judgment normal.      Assessment & Plan:  Essential hypertension At goal today in office (146/86), with even better average measurements at home (110s/70s). Likely higher than home readings 2/2 white coat hypertension, as patient has taken HCTZ this AM. As patient is at goal and has no complaints about HCTZ, will continue at current dose of 50mg  qd. As patient has had BP at goal for past two visits, will space visits out to q3 months. Encouraged patient to continue checking BP at home and to schedule appt sooner if he notices persistently increased BP.  - Continue HCTZ 50mg  qd - F/u in 3 months   Healthcare maintenance Patient behind on colonoscopy (have never had one), as well as HCV/HIV screening. Agreeable to colonoscopy, so referral to GI placed today. Patient  does not wish to have blood drawn today, but said he will consider this for next appt.    Tarri AbernethyAbigail J Nadalie Laughner, MD, MPH PGY-3 [facility] Pager [phone]

## 2017-10-24 NOTE — Assessment & Plan Note (Signed)
Patient behind on colonoscopy (have never had one), as well as HCV/HIV screening. Agreeable to colonoscopy, so referral to GI placed today. Patient does not wish to have blood drawn today, but said he will consider this for next appt.

## 2017-11-01 ENCOUNTER — Encounter: Payer: Self-pay | Admitting: Gastroenterology

## 2017-12-22 ENCOUNTER — Encounter: Payer: BLUE CROSS/BLUE SHIELD | Admitting: Gastroenterology

## 2018-06-25 ENCOUNTER — Ambulatory Visit: Payer: BLUE CROSS/BLUE SHIELD | Admitting: Family Medicine

## 2018-06-25 ENCOUNTER — Other Ambulatory Visit: Payer: Self-pay | Admitting: Family Medicine

## 2018-06-25 VITALS — BP 150/80 | HR 60 | Temp 98.7°F | Ht 70.0 in | Wt 298.0 lb

## 2018-06-25 DIAGNOSIS — R490 Dysphonia: Secondary | ICD-10-CM | POA: Diagnosis not present

## 2018-06-25 DIAGNOSIS — R002 Palpitations: Secondary | ICD-10-CM | POA: Diagnosis not present

## 2018-06-25 MED ORDER — CETIRIZINE HCL 10 MG PO TABS: 10.0000 mg | ORAL_TABLET | Freq: Every day | ORAL | 11 refills | Status: AC

## 2018-06-25 MED ORDER — HYDROCHLOROTHIAZIDE 50 MG PO TABS: ORAL_TABLET | ORAL | 3 refills | Status: DC

## 2018-06-25 MED ORDER — FLUTICASONE PROPIONATE 50 MCG/ACT NA SUSP: 2.0000 | Freq: Every day | NASAL | 0 refills | Status: DC

## 2018-06-25 NOTE — Progress Notes (Signed)
   Subjective:    Patient ID: Carlos Knight, male    DOB: 11/23/1955, 62 y.o.   MRN: [id number]   CC: Hoarseness and sinus drainage  HPI: Patient is a 62 yo male who presents today complaining of hoarseness, sinus drainage and losing voice intermittently for the past two months.  Patient reports symptoms started 2 months ago [place]  Patient that he got a viral illness in the airplane.  Initially had some congestion and nasal drainage. Patient reports that since then he has experienced hoarseness at times and has sometimes loss his voice. Patient has not tried any medications for it. 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 Patient also reports some nasal drainage, itchy eyes. Patient denies any cough, throat pain, shortness of breath, fever, chills, difficulty swallowing, weigh loss, night sweat and loss of appetite.   Smoking status reviewed   ROS: all other systems were reviewed and are negative other than in the HPI   Past Medical History:  Diagnosis Date  . Back pain, chronic   . Cardiac murmur    since child hood takes prophylactic antibiotics before dental procedures  . GERD (gastroesophageal reflux disease)   . Hyperlipidemia   . Hypertension   . Impotence    previously on viagra  . Pre-diabetes     Past Surgical History:  Procedure Laterality Date  . tonsillectomy  1996   adenoids also removed    Past medical history, surgical, family, and social history reviewed and updated in the EMR as appropriate.  Objective:  BP (!) 150/80 Comment: large cuff  Pulse 60   Temp 98.7 F (37.1 C) (Oral)   Ht 5\' 10"  (1.778 m)   Wt 298 lb (135.2 kg)   SpO2 95%   BMI 42.76 kg/m   Vitals and nursing note reviewed  General: NAD, pleasant, able to participate in exam HEENT: Oropharynx clear, no lymphadenopathy, no thyromegaly  Cardiac: RRR, normal heart sounds, no murmurs.  2+ radial and PT pulses bilaterally Respiratory: CTAB except for some mild crackle at the base of the right lung, normal effort, No wheezes, rales or rhonchi Abdomen: soft, nontender, nondistended, no hepatic or splenomegaly, +BS Extremities: no edema or cyanosis. WWP. Skin: warm and dry, no rashes noted Neuro: alert and oriented x4, no focal deficits Psych: Normal affect and mood   Assessment & Plan:    Hoarseness Patient presents with hoarseness and nasal drainage for the past 2 months. Symptoms seems to have started after what patient thought was a viral infection. No red flags concerning for infection or mass. Patient has drainage and itchy eyes likely allergies which could be contributing to symptoms. 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 Patient has a history of hoarseness and was previously referred to ENT back in 2012 thought to be secondary to multiple speaking engagement. Symptoms likely secondary to URI with an allergy component. Low suspicion for carcinoma given intermittent nature of patient symptoms. --Start patient on Flonase and Zyrtec and will follow up on symptoms. --No need for Xray, crackles on RLL likely atelectasis --If symptoms persist will refer to ENT for further evaluation.    Lovena NeighboursAbdoulaye Kiya Eno, MD [facility] PGY-3

## 2018-06-25 NOTE — Assessment & Plan Note (Signed)
Patient presents with hoarseness and nasal drainage for the past 2 months. Symptoms seems to have started after what patient thought was a viral infection. No red flags concerning for infection or mass. Patient has drainage and itchy eyes likely allergies which could be contributing to symptoms. 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 Patient has a history of hoarseness and was previously referred to ENT back in 2012 thought to be secondary to multiple speaking engagement. Symptoms likely secondary to URI with an allergy component. Low suspicion for carcinoma given intermittent nature of patient symptoms. --Start patient on Flonase and Zyrtec and will follow up on symptoms. --No need for Xray, crackles on RLL likely atelectasis --If symptoms persist will refer to ENT for further evaluation.

## 2018-06-25 NOTE — Patient Instructions (Signed)
It was great seeing you today! We have addressed the following issues today  1. Please use Flonase and Zyrtec as prescribed, I think this is secondary to allergies and post viral infection.  If we did any lab work today, and the results require attention, either me or my nurse will get in touch with you. If everything is normal, you will get a letter in mail and a message via . If you don't hear from us in two weeks, please give us a call. Otherwise, we look forward to seeing you again at your next visit. If you have any questions or concerns before then, please call the clinic at [phone].  Please bring all your medications to every doctors visit  Sign up for My Chart to have easy access to your labs results, and communication with your Primary care physician. Please ask Front Desk for some assistance.   Please check-out at the front desk before leaving the clinic.    Take Care,   Dr. Sydnee Cabaliallo

## 2018-08-14 ENCOUNTER — Telehealth: Payer: Self-pay | Admitting: Family Medicine

## 2018-08-14 NOTE — Telephone Encounter (Signed)
Pt called because he needs a referral to see an ENT doctor he is having some of the same issues as before and would like to go back. jw

## 2018-08-15 ENCOUNTER — Other Ambulatory Visit: Payer: Self-pay | Admitting: Family Medicine

## 2018-08-15 DIAGNOSIS — R490 Dysphonia: Secondary | ICD-10-CM

## 2018-08-15 NOTE — Telephone Encounter (Signed)
Referral for ENT placed.  Orpah Clinton, PGY-2 [facility] 08/15/2018 4:38 PM

## 2018-10-23 DIAGNOSIS — J381 Polyp of vocal cord and larynx: Secondary | ICD-10-CM | POA: Diagnosis not present

## 2018-10-23 DIAGNOSIS — K219 Gastro-esophageal reflux disease without esophagitis: Secondary | ICD-10-CM | POA: Diagnosis not present

## 2018-10-23 DIAGNOSIS — R490 Dysphonia: Secondary | ICD-10-CM | POA: Diagnosis not present

## 2018-10-24 ENCOUNTER — Other Ambulatory Visit: Payer: Self-pay

## 2018-10-24 ENCOUNTER — Ambulatory Visit: Payer: BLUE CROSS/BLUE SHIELD | Admitting: Family Medicine

## 2018-10-24 VITALS — BP 142/72 | HR 56 | Temp 97.9°F | Ht 70.0 in | Wt 294.0 lb

## 2018-10-24 DIAGNOSIS — K219 Gastro-esophageal reflux disease without esophagitis: Secondary | ICD-10-CM | POA: Diagnosis not present

## 2018-10-24 DIAGNOSIS — S39012A Strain of muscle, fascia and tendon of lower back, initial encounter: Secondary | ICD-10-CM

## 2018-10-24 DIAGNOSIS — M5432 Sciatica, left side: Secondary | ICD-10-CM

## 2018-10-24 MED ORDER — GABAPENTIN 300 MG PO CAPS: 300.0000 mg | ORAL_CAPSULE | Freq: Three times a day (TID) | ORAL | 0 refills | Status: DC

## 2018-10-24 MED ORDER — OMEPRAZOLE 40 MG PO CPDR: 40.0000 mg | DELAYED_RELEASE_CAPSULE | Freq: Every day | ORAL | 0 refills | Status: DC

## 2018-10-24 NOTE — Patient Instructions (Addendum)
It was great meeting you today! I am sorry that you have been having a tough time with your back since thanksgiving. I am glad that your muscle strain is improving. It sounds like sciatica is your larger issue. I will start a medication called gabapentin which should help calm that nerve pain down a little bit. You can also buy some over the counter creams, pictures if which I gave you. Please let me know if you have any issues.  I changed your omeprazole to 40mg  daily so that your insurance would cover it. Please let me know if there are any issues.

## 2018-10-27 DIAGNOSIS — S39012A Strain of muscle, fascia and tendon of lower back, initial encounter: Secondary | ICD-10-CM | POA: Insufficient documentation

## 2018-10-27 DIAGNOSIS — M5432 Sciatica, left side: Secondary | ICD-10-CM | POA: Insufficient documentation

## 2018-10-27 NOTE — Assessment & Plan Note (Addendum)
Changed doses of omeprazole to 40 mg daily so his insurance will cover that.  Follow-up in a schedule appointment with ENT.

## 2018-10-27 NOTE — Assessment & Plan Note (Signed)
Likely muscular skeletal strain of paraspinal lumbar muscle.  Appears to be improving on his own.  Patient has been taking a little bit of ibuprofen which has helped.  He is not interested in any other medications for this problem.

## 2018-10-27 NOTE — Progress Notes (Signed)
   HPI 62 year old who presents with back pain.  He states that he was moving some boxes over Thanksgiving and noticed a crampy pain in his left lower back.  He has had this before and thinks it is a muscle strain.  He says that he thinks he is on the downhill slope of this and is improving every day.  Main concern is a shooting, "electricity type pain" occasionally burning shooting down his left leg.  He states that he has a known degenerative disc disease in his lumbar spine and thinks he has sciatica.   Patient states that recently seen by ENT for a "esophageal issue".  He was given a prescription for omeprazole 20 mg.  Unfortunately insurance will not cover a dosage that he can get over-the-counter.  He is requesting some way to fix this so insurance will cover it.   CC: "Sciatica pain"   ROS:   Review of Systems See HPI for ROS.   CC, SH/smoking status, and VS noted  Objective: BP (!) 142/72   Pulse (!) 56   Temp 97.9 F (36.6 C) (Oral)   Ht 5\' 10"  (1.778 m)   Wt 294 lb (133.4 kg)   SpO2 96%   BMI 42.18 kg/m  Gen: 62 year old African-American gentleman, resting comfortably in bed, no acute distress, very pleasant HEENT: NCAT, EOMI, PERRL CV: RRR, no murmur Resp: CTAB, no wheezes, non-labored Abd: SNTND, BS present, no guarding or organomegaly Ext: No edema, warm Neuro: No tenderness to palpation lumbar spine.  Able to elicit shooting pain down left leg with straight leg test.   Assessment and plan:  Sciatica of left side Patient with symptoms consistent with sciatica on the left side.  Has known DJD in lumbar spine.  We will treat with gabapentin 300 mg 3 times daily.  Patient also voiced understanding that he needs to lose some weight and is motivated to do so after Thanksgiving.  GASTROESOPHAGEAL REFLUX DISEASE Changed doses of omeprazole to 40 mg daily so his insurance will cover that.  Follow-up in a schedule appointment with ENT.  Strain of lumbar paraspinal  muscle Likely muscular skeletal strain of paraspinal lumbar muscle.  Appears to be improving on his own.  Patient has been taking a little bit of ibuprofen which has helped.  He is not interested in any other medications for this problem.   No orders of the defined types were placed in this encounter.   Meds ordered this encounter  Medications  . gabapentin (NEURONTIN) 300 MG capsule    Sig: Take 1 capsule (300 mg total) by mouth 3 (three) times daily.    Dispense:  90 capsule    Refill:  0  . omeprazole (PRILOSEC) 40 MG capsule    Sig: Take 1 capsule (40 mg total) by mouth daily.    Dispense:  60 capsule    Refill:  0     Myrene BuddyJacob Peggie Hornak MD PGY-2 Family Medicine Resident  10/27/2018 7:23 PM

## 2018-10-27 NOTE — Assessment & Plan Note (Signed)
Patient with symptoms consistent with sciatica on the left side.  Has known DJD in lumbar spine.  We will treat with gabapentin 300 mg 3 times daily.  Patient also voiced understanding that he needs to lose some weight and is motivated to do so after Thanksgiving.

## 2018-11-18 ENCOUNTER — Other Ambulatory Visit: Payer: Self-pay | Admitting: Family Medicine

## 2018-12-14 ENCOUNTER — Other Ambulatory Visit: Payer: Self-pay | Admitting: Family Medicine

## 2018-12-24 ENCOUNTER — Other Ambulatory Visit: Payer: Self-pay

## 2018-12-24 DIAGNOSIS — R002 Palpitations: Secondary | ICD-10-CM

## 2018-12-24 MED ORDER — HYDROCHLOROTHIAZIDE 50 MG PO TABS: ORAL_TABLET | ORAL | 3 refills | Status: DC

## 2018-12-26 ENCOUNTER — Other Ambulatory Visit: Payer: Self-pay | Admitting: Family Medicine

## 2019-01-17 ENCOUNTER — Other Ambulatory Visit: Payer: Self-pay | Admitting: *Deleted

## 2019-01-17 MED ORDER — GABAPENTIN 300 MG PO CAPS: ORAL_CAPSULE | ORAL | 0 refills | Status: AC

## 2019-01-17 MED ORDER — IBUPROFEN 400 MG PO TABS: ORAL_TABLET | ORAL | 0 refills | Status: AC

## 2019-01-17 NOTE — Telephone Encounter (Signed)
Pt states that he is having a sciatic flare. Ernie Sagrero, Maryjo Rochester, CMA

## 2019-06-05 ENCOUNTER — Ambulatory Visit (INDEPENDENT_AMBULATORY_CARE_PROVIDER_SITE_OTHER): Payer: BC Managed Care – PPO | Admitting: Family Medicine

## 2019-06-05 ENCOUNTER — Ambulatory Visit (HOSPITAL_COMMUNITY)
Admission: RE | Admit: 2019-06-05 | Discharge: 2019-06-05 | Disposition: A | Discharge status: Home / Self Care | Payer: BC Managed Care – PPO | Source: Ambulatory Visit | Attending: Family Medicine | Admitting: Family Medicine

## 2019-06-05 ENCOUNTER — Other Ambulatory Visit: Payer: Self-pay

## 2019-06-05 ENCOUNTER — Ambulatory Visit
Admission: RE | Admit: 2019-06-05 | Discharge: 2019-06-05 | Disposition: A | Discharge status: Home / Self Care | Payer: BC Managed Care – PPO | Source: Ambulatory Visit | Attending: Family Medicine | Admitting: Family Medicine

## 2019-06-05 VITALS — BP 138/62 | HR 63

## 2019-06-05 DIAGNOSIS — M7989 Other specified soft tissue disorders: Secondary | ICD-10-CM | POA: Diagnosis not present

## 2019-06-05 DIAGNOSIS — M25561 Pain in right knee: Secondary | ICD-10-CM

## 2019-06-05 NOTE — Patient Instructions (Signed)
It was so wonderful meeting you.  I am so sorry that you are in so much pain on your right knee.  We are to send you over for an ultrasound of your right lower leg just to make sure there is no blood clot, however I suspect the swelling is likely related to your knee.  We will also get x-rays of your knee as well.  I will also let you know the results of your lab work today.  You can continue taking ibuprofen as needed for pain.

## 2019-06-05 NOTE — Progress Notes (Signed)
Lower extremity venous has been completed.   Preliminary results in CV Proc.   Abram Sander 06/05/2019 4:07 PM

## 2019-06-05 NOTE — Progress Notes (Signed)
Subjective:    Patient ID: Carlos Knight, male    DOB: 06/29/1956, 63 y.o.   MRN: [id number]   CC: knee pain    HPI: [name] is a 63 year old gentleman with a history of arthritis, obesity, prediabetes, and hypertension presenting discuss the following:  Severe knee pain: Acute onset approximately 3 weeks ago on the right knee, progressively worsening of pain with swelling of the knee.  Now he is decided to seek care as he had noticed the swelling extending down into his calf/ankle on the right.  He has been trying ibuprofen 800 mg 2-3 times a day which is been helpful, alternating days sometimes with naproxen 500 mg twice daily.  His knee is most painful across the top.  All movements seem to hurt, especially bending, however surprisingly improves a little bit with walking.  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 Unfortunately, had to take a few days off of work last week because the pain was so severe even with pushing on the gas/brake pedals.  Denies any recent injuries, traumas, or strange movement to the joint.  No recent increase in exercise regimen.  This knee has hurt in the past, but never this severe.  Does have a history of back arthritis and believes he does have arthritis in his knees.  Has never had gout before, however several of his family members have a history of this.  Denies significant alcohol use or seafood intake.  Denies any associated fever, rash, joint erythema, weakness, numbness.  No history of DVT, had superficial thrombophlebitis in his right arm numerous years ago.   Healthcare maintenance due including hepatitis C, HIV, Tdap, and colonoscopy >> also follows at the [facility] and gets routine blood work performed there.   Review of Systems Per HPI   Patient Active Problem List   Diagnosis Date Noted  . Acute pain of right knee 06/07/2019  . Swelling of right lower extremity 06/07/2019  . Sciatica of left side 10/27/2018  .  Strain of lumbar paraspinal muscle 10/27/2018  . Healthcare maintenance 10/24/2017  . Acute pain of left knee 08/22/2017  . Palpitations 06/16/2017  . Benign paroxysmal positional vertigo 04/23/2014  . Lumbar radicular pain 09/04/2013  . Obesity 12/31/2010  . Hoarseness 12/31/2010  . Essential hypertension 12/03/2010  . DENTAL PAIN 12/03/2010  . GASTROESOPHAGEAL REFLUX DISEASE 05/22/2009  . PRE-DIABETES 05/22/2009  . HYPERLIPIDEMIA 01/18/2007  . OBESITY, NOS 01/18/2007  . IMPOTENCE INORGANIC 01/18/2007     Objective:  BP 138/62   Pulse 63   SpO2 96%  Vitals and nursing note reviewed  General: NAD, pleasant Cardiac: RRR, normal heart sounds, no murmurs Respiratory: CTAB, normal effort Abdomen: soft, nontender, nondistended Extremities: Left lower extremity without any deformities with 5/5 muscle strength, preserved ROM, nontender to palpation of knee joint.  Right leg: Visual inspection notable for swelling of right knee compared to left.  Knee nonerythematous, however warm to touch. Tender to palpation in prepatellar region.  1-2+ pitting edema of right lower extremity to level of knee.  Sensation to light touch intact.  ROM intact, only painful with active ROM.  5/5 muscle strength, full effort limited secondary to pain.  Lachman's and McMurray's test negative. Skin: warm and dry, no rashes noted Neuro: alert and oriented, no focal deficits Psych: normal affect  Assessment & Plan:   Acute pain of right knee Severe, specifically tender over prepatellar region associated with joint effusion, warmth to  touch, and painful active ROM.  Differential broad-including gout, prepatellar bursitis, osteoarthritis, intra-articular pathology/ligamentous injury. Given clinical presentation as above, most likely suspect gout.  Without presenting fever, erythema, or restriction of motion, doubt septic arthritis.  Additionally, with no preceding injury or trauma believe ligamentous/meniscal injury  is less likely. - Ibuprofen 800 mg 3 times daily until symptoms resolved (hopefully within the next 1 week) - Serum uric acid (unfortunately did not perform a joint aspiration on day of visit)  - BMP, assess renal function given consistent use of NSAIDs (last 1.0 in 2018)  - Rest joint, ice region for 20 minutes at a time as needed, elevate leg - Knee 4 view x-rays - Follow-up pending above, likely in the next few weeks if symptoms not improving or sooner if worsening  Swelling of right lower extremity Few day history of 1-2+ pitting edema on the right lower extremity to the level of knee.  No previous history of DVT, CHF, venous insufficiency. 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 While the swelling is likely related to primary knee issue, concern for DVT given presentation and believe it would be reasonable to rule this out with ultrasound today. - DVT ultrasound performed, resulted negative for blood clot   UPDATE: Knee x-rays showing spontaneous osteonecrosis of knee.  Discussed these findings with orthopedic surgeon on call, Dr. Francee Piccolo, who recommended conservative therapy to start including reducing weightbearing with crutches, RICE, and NSAIDs.  Given these findings, long-term prognosis may include requiring a knee replacement.  Will consider referral to orthopedics if knee pain not improving with conservative therapy as above.  While knee x-rays were showing this condition, also question if there is contributing gout given such severe pain with swollen joint and uric acid of 9.3.  Recommended patient to continue NSAIDs, reassured that his pain is already improved since yesterday not requiring any pain relief.  Instructed to monitor diet including meat, seafood, sugar intake.  Could consider short course of steroids if symptoms return.  Patient already has crutches at home, will use this to reduce weightbearing for the next several weeks.  Follow-up in the next 4-6  weeks if not improving or sooner if worsening.  [facility] Resident PGY-2

## 2019-06-06 ENCOUNTER — Telehealth: Payer: Self-pay | Admitting: *Deleted

## 2019-06-06 LAB — BASIC METABOLIC PANEL
BUN/Creatinine Ratio: 16 (ref 10–24)
BUN: 18 mg/dL (ref 8–27)
CO2: 22 mmol/L (ref 20–29)
Calcium: 8.9 mg/dL (ref 8.6–10.2)
Chloride: 107 mmol/L — ABNORMAL HIGH (ref 96–106)
Creatinine, Ser: 1.14 mg/dL (ref 0.76–1.27)
GFR calc Af Amer: 79 mL/min/{1.73_m2} (ref >59)
GFR calc non Af Amer: 69 mL/min/{1.73_m2} (ref >59)
Glucose: 94 mg/dL (ref 65–99)
Potassium: 4 mmol/L (ref 3.5–5.2)
Sodium: 148 mmol/L — ABNORMAL HIGH (ref 134–144)

## 2019-06-06 LAB — URIC ACID: Uric Acid: 9.3 mg/dL — ABNORMAL HIGH (ref 3.7–8.6)

## 2019-06-06 NOTE — Telephone Encounter (Signed)
Pt states that he is returning a call. Will forward to MD.   Christen Bame, CMA

## 2019-06-07 ENCOUNTER — Telehealth: Payer: Self-pay | Admitting: Family Medicine

## 2019-06-07 ENCOUNTER — Encounter: Payer: Self-pay | Admitting: Family Medicine

## 2019-06-07 DIAGNOSIS — M7989 Other specified soft tissue disorders: Secondary | ICD-10-CM | POA: Insufficient documentation

## 2019-06-07 DIAGNOSIS — M25561 Pain in right knee: Secondary | ICD-10-CM | POA: Insufficient documentation

## 2019-06-07 NOTE — Assessment & Plan Note (Signed)
Few day history of 1-2+ pitting edema on the right lower extremity to the level of knee.  No previous history of DVT, CHF, venous insufficiency. 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 While the swelling is likely related to primary knee issue, concern for DVT given presentation and believe it would be reasonable to rule this out with ultrasound today. - DVT ultrasound performed, resulted negative for blood clot

## 2019-06-07 NOTE — Telephone Encounter (Signed)
Noted significant x-ray finding of spontaneous osteonecrosis of his right knee.  Discussed imaging results and clinical presentation with orthopedic surgeon on-call, Dr. Stann Mainland, who recommended proceeding with conservative therapy including reducing weightbearing activities, ice, elevating leg, and NSAIDs.  If no improvement or recurrent after several weeks, could consider MRI of this region vs referral to orthopedics.  Called patient to discuss his results.  States his knee is already feeling much better today, has not had to use any pain medication since early yesterday.  Significant improvement compared to when seen in the clinic.  Discussed short course of steroids to help with inflammation, however given current improvements we decided to hold on this.  Patient fortunately already has a pair of crutches at home, will use to reduce weightbearing on this joint.  Instructed to follow-up if not continue to improve over the next few weeks or sooner if worsening.  Patriciaann Clan, DO

## 2019-06-07 NOTE — Telephone Encounter (Signed)
Opened in error

## 2019-06-07 NOTE — Assessment & Plan Note (Addendum)
Severe, specifically tender over prepatellar region associated with joint effusion, warmth to touch, and painful active ROM.  Differential broad-including gout, prepatellar bursitis, osteoarthritis, intra-articular pathology/ligamentous injury. Given clinical presentation as above, most likely suspect gout.  Without presenting fever, erythema, or restriction of motion, doubt septic arthritis.  Additionally, with no preceding injury or trauma believe ligamentous/meniscal injury is less likely. - Ibuprofen 800 mg 3 times daily until symptoms resolved (hopefully within the next 1 week) - Serum uric acid (unfortunately did not perform a joint aspiration on day of visit)  - BMP, assess renal function given consistent use of NSAIDs (last 1.0 in 2018)  - Rest joint, ice region for 20 minutes at a time as needed, elevate leg - Knee 4 view x-rays - Follow-up pending above, likely in the next few weeks if symptoms not improving or sooner if worsening

## 2019-06-07 NOTE — Telephone Encounter (Signed)
Call patient earlier today to discuss results.  Please see telephone encounter.  Thank you!

## 2019-06-14 ENCOUNTER — Telehealth: Payer: Self-pay | Admitting: *Deleted

## 2019-06-14 NOTE — Telephone Encounter (Signed)
Pt states that his knee has started to hurt worse.  Is wondering if he can have some stronger pain meds.  Christen Bame, CMA

## 2019-06-17 ENCOUNTER — Other Ambulatory Visit: Payer: Self-pay | Admitting: Family Medicine

## 2019-06-17 DIAGNOSIS — M25561 Pain in right knee: Secondary | ICD-10-CM

## 2019-06-17 MED ORDER — TRAMADOL HCL 50 MG PO TABS: 50.0000 mg | ORAL_TABLET | Freq: Three times a day (TID) | ORAL | 0 refills | Status: AC | PRN

## 2019-06-17 NOTE — Telephone Encounter (Signed)
Please let patient know I sent in 12 tablets of tramadol 50 mg every 8 hours as needed for more severe pain.  He may still use naproxen 500 mg twice daily or ibuprofen 800 mg 3 times daily PRN. Additionally, reemphasize the importance of minimal weightbearing on this joint (using crutches when can), ice/heat, and elevating leg.  Would recommend following up if pain does not improve in the next 1-2 weeks or sooner if worsening.  Pending additional evaluation at that time, could consider referral to orthopedics.  Thank you!  Patriciaann Clan, DO

## 2019-06-17 NOTE — Telephone Encounter (Signed)
Spoke to patient and informed him of RX and instructions per Dr. Higinio Plan.  Patient appreciative and voiced understanding.  Ozella Almond, [place]

## 2019-09-23 ENCOUNTER — Other Ambulatory Visit: Payer: Self-pay | Admitting: *Deleted

## 2019-09-23 DIAGNOSIS — R002 Palpitations: Secondary | ICD-10-CM

## 2019-09-23 MED ORDER — HYDROCHLOROTHIAZIDE 50 MG PO TABS: ORAL_TABLET | ORAL | 3 refills | Status: AC

## 2020-01-17 IMAGING — CR RIGHT KNEE - COMPLETE 4+ VIEW
4 series · 4 of 4 positions shown · non-contrast
Comparison: None.

CLINICAL DATA: Acute knee pain, effusion. Anterior knee pain and
swelling, no injury.

EXAM:
RIGHT KNEE - COMPLETE 4+ VIEW

[t knee ap right]
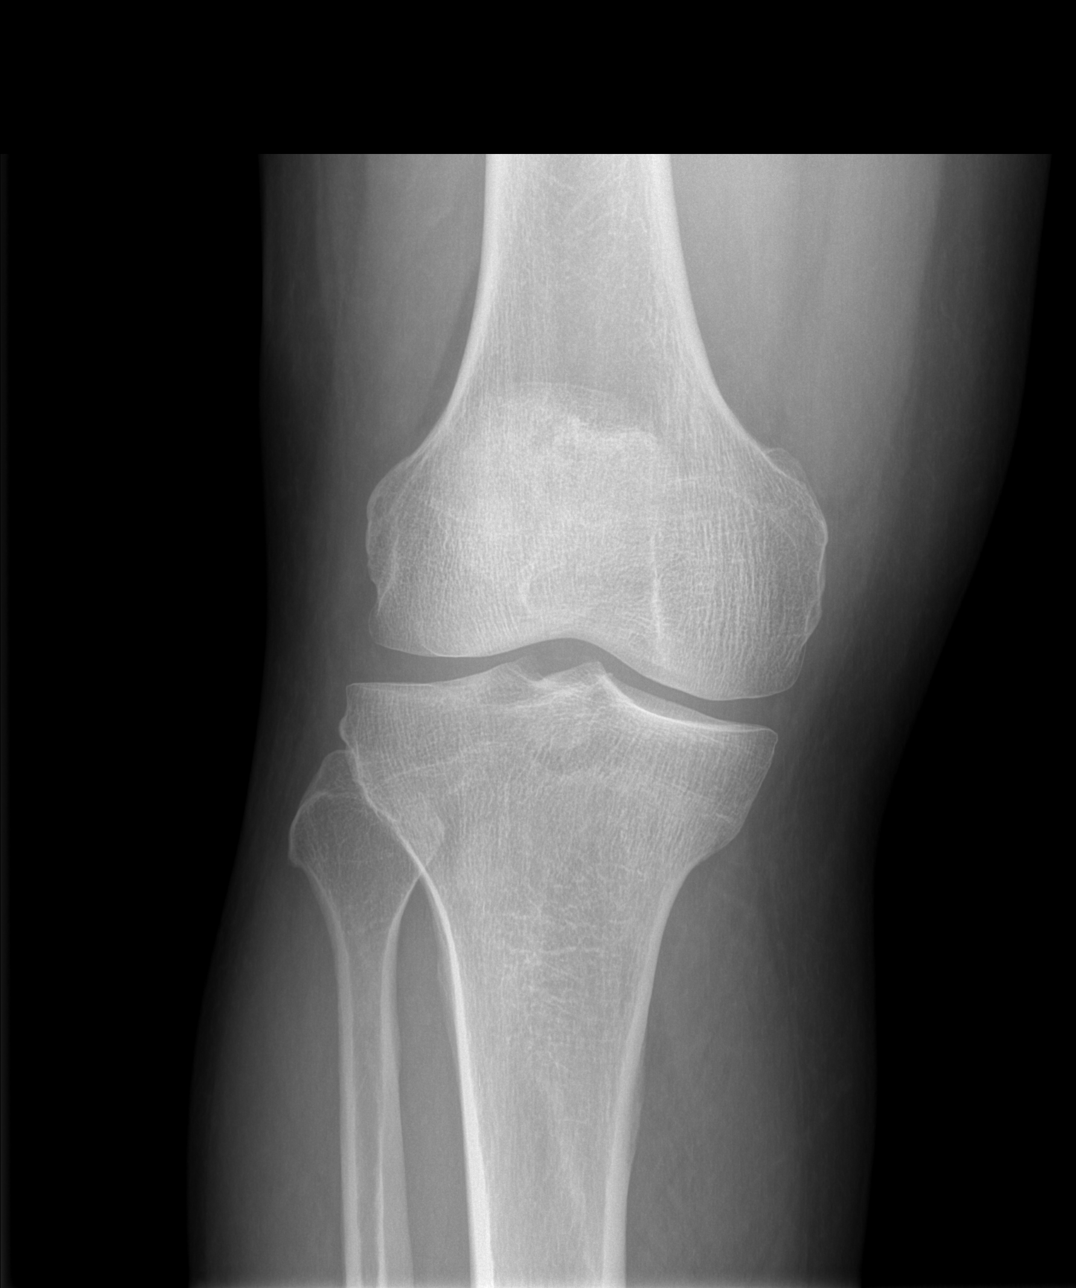

[t knee oblique right (1 of 2)]
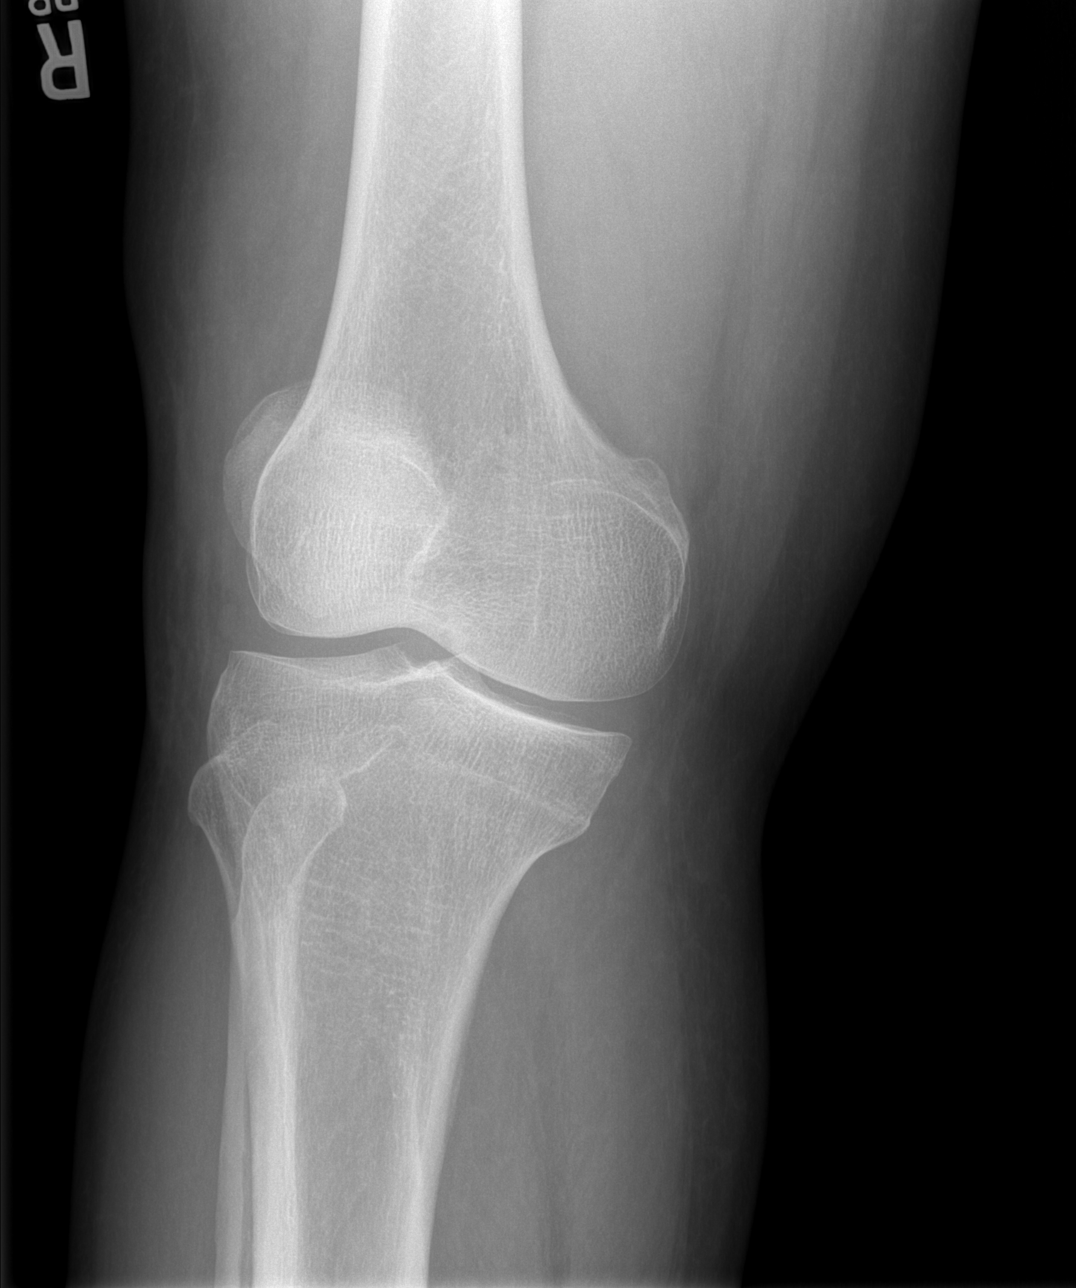

[t knee oblique right (2 of 2)]
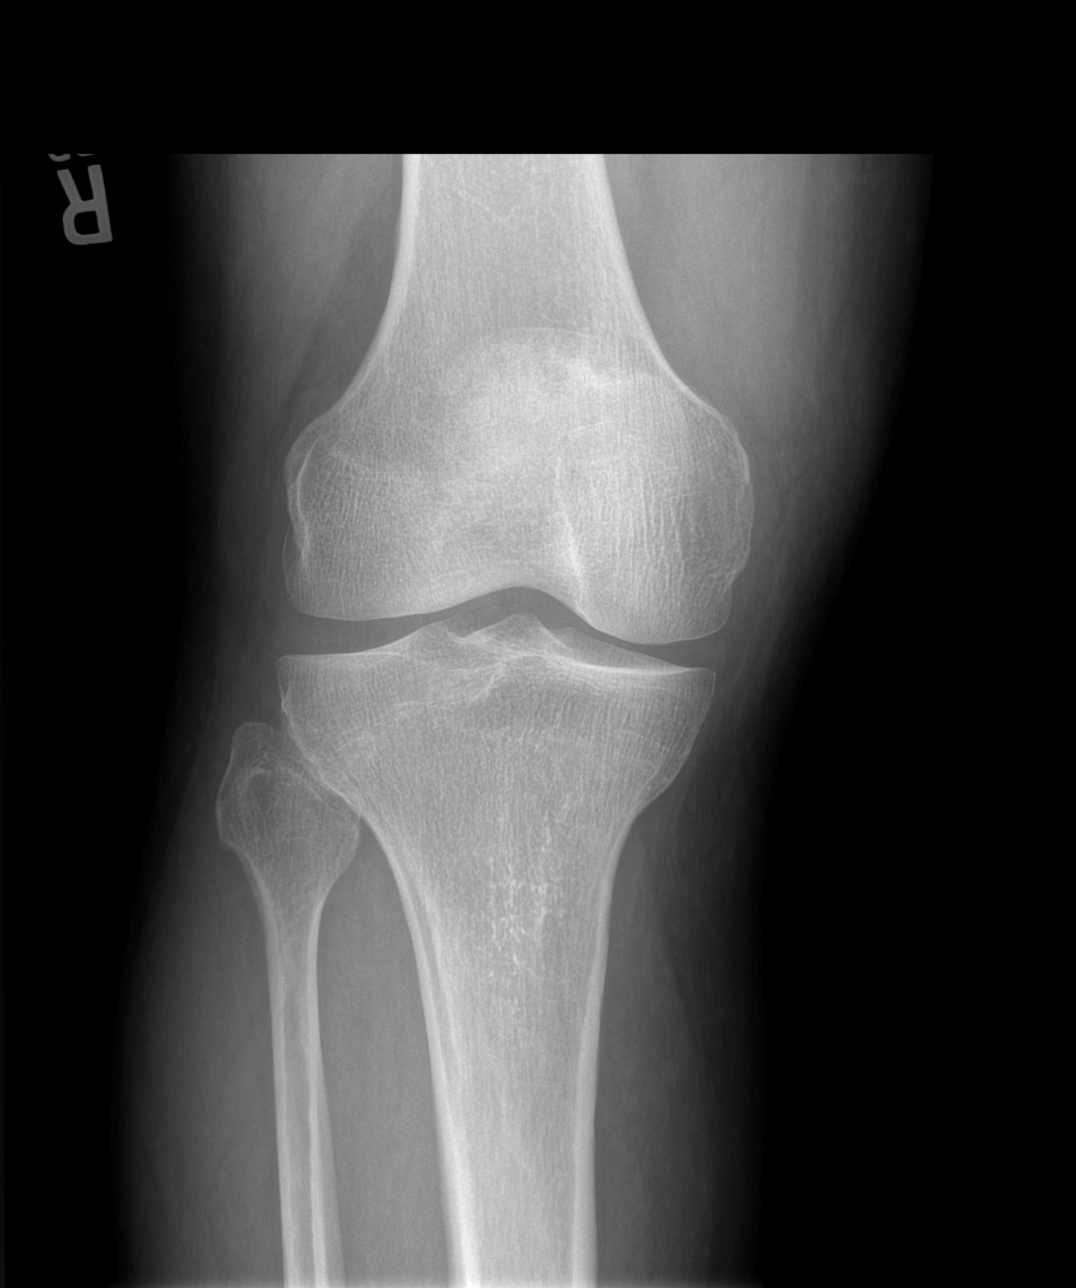

[t knee lat right]
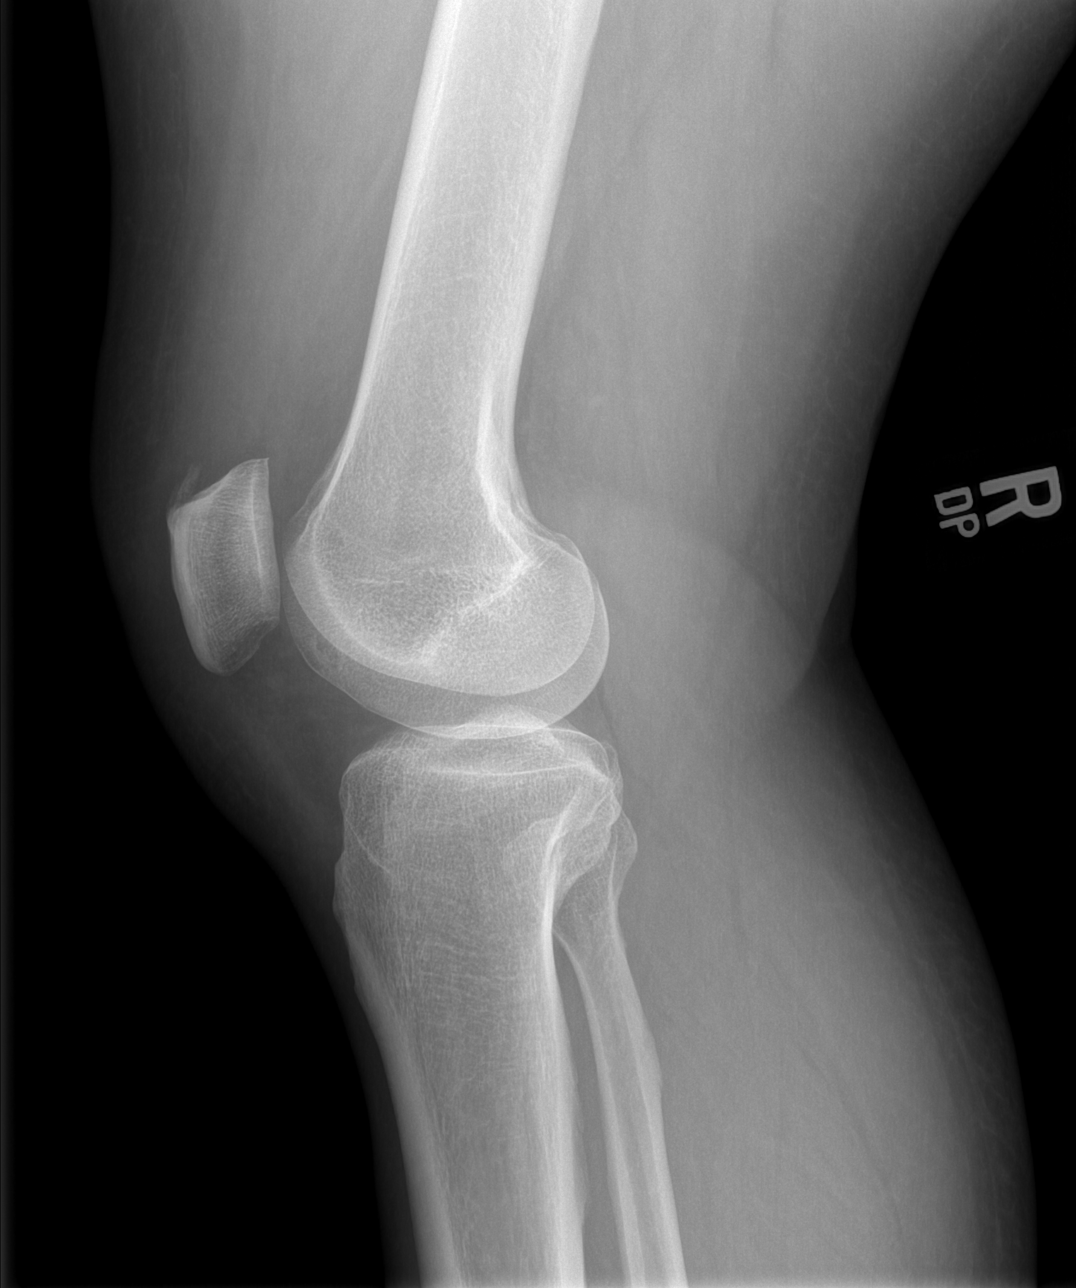

[4 of 4 positions shown; findings below may reference images not displayed]

FINDINGS: Small subcortical lucency along the anterior articular surface of
the medial femoral condyle best seen on lateral radiograph could
reflect a small subchondral insufficiency fracture. No other acute
fracture traumatic malalignment. Small right knee joint effusion.
Enthesopathic changes are noted at the patellar insertion of the
distal quadriceps tendon.
IMPRESSION: Small lucency along the anterior articular surface of the medial
femoral condyle could reflect a subchondral insufficiency fracture
previously known as spontaneous osteonecrosis of the knee (SONK).

## 2020-02-01 ENCOUNTER — Ambulatory Visit: Payer: Self-pay | Attending: Internal Medicine

## 2020-02-01 DIAGNOSIS — Z23 Encounter for immunization: Secondary | ICD-10-CM

## 2020-02-01 NOTE — Progress Notes (Signed)
   Covid-19 Vaccination Clinic  Name:  Carlos Knight    MRN: [id number] DOB: [birth year]  02/01/2020  Carlos Knight was observed post Covid-19 immunization for 15 minutes without incident. He was provided with Vaccine Information Sheet and instruction to access the V-Safe system.   Carlos Knight was instructed to call 911 with any severe reactions post vaccine: Marland Kitchen Difficulty breathing  . Swelling of face and throat  . A fast heartbeat  . A bad rash all over body  . Dizziness and weakness   Immunizations Administered    Name Date Dose VIS Date Route   Moderna COVID-19 Vaccine 02/01/2020 10:53 AM 0.5 mL 10/22/2019 Intramuscular   Manufacturer: Moderna   Lot: [id number]   NDC: 72257-505-18

## 2020-03-04 ENCOUNTER — Ambulatory Visit: Payer: Self-pay | Attending: Internal Medicine

## 2020-03-04 DIAGNOSIS — Z23 Encounter for immunization: Secondary | ICD-10-CM

## 2020-03-04 NOTE — Progress Notes (Signed)
   Covid-19 Vaccination Clinic  Name:  Carlos Knight    MRN: [id number] DOB: 06-25-1956  03/04/2020  Carlos Knight was observed post Covid-19 immunization for 30 minutes based on pre-vaccination screening without incident. He was provided with Vaccine Information Sheet and instruction to access the V-Safe system.   Carlos Knight was instructed to call 911 with any severe reactions post vaccine: Marland Kitchen Difficulty breathing  . Swelling of face and throat  . A fast heartbeat  . A bad rash all over body  . Dizziness and weakness   Immunizations Administered    Name Date Dose VIS Date Route   Moderna COVID-19 Vaccine 03/04/2020 10:20 AM 0.5 mL 10/22/2019 Intramuscular   Manufacturer: Moderna   Lot: [id number]   NDC: 14782-956-21

## 2022-04-26 ENCOUNTER — Encounter: Payer: Self-pay | Admitting: *Deleted

## 2024-03-15 ENCOUNTER — Encounter: Payer: Self-pay | Admitting: Gastroenterology

## 2024-04-23 ENCOUNTER — Ambulatory Visit (AMBULATORY_SURGERY_CENTER): Payer: Self-pay | Admitting: *Deleted

## 2024-04-23 VITALS — Ht 70.0 in | Wt 280.0 lb

## 2024-04-23 DIAGNOSIS — Z1212 Encounter for screening for malignant neoplasm of rectum: Secondary | ICD-10-CM

## 2024-04-23 DIAGNOSIS — Z1211 Encounter for screening for malignant neoplasm of colon: Secondary | ICD-10-CM

## 2024-04-23 MED ORDER — NA SULFATE-K SULFATE-MG SULF 17.5-3.13-1.6 GM/177ML PO SOLN: 1.0000 | Freq: Once | ORAL | 0 refills | Status: AC

## 2024-04-23 NOTE — Progress Notes (Signed)
 Pt's name and DOB verified at the beginning of the pre-visit wit 2 identifiers  Pt denies any difficulty with ambulating,sitting, laying down or rolling side to side  Pt has no issues moving head neck or swallowing  No egg or soy allergy known to patient   No issues known to pt with past sedation with any surgeries or procedures  Patient denies ever being intubated  No FH of Malignant Hyperthermia  Pt is not on home 02   Pt is not on blood thinners   Pt denies issues with constipation   Pt has frequent issues with constipation RN instructed pt to use Miralax per bottles instructions a week before prep days. Pt states they will  Pt is not on dialysis  Pt hx of heart murmur  Pt denies any upcoming cardiac testing  Patient's chart reviewed by Rogena Class CNRA prior to pre-visit and patient appropriate for the [facility].  Pre-visit completed and red dot placed by patient's name on their procedure day (on provider's schedule).    Visit by phone  Pt states weight is 280 lb  IInstructions reviewed. Pt given  both [facility] main # and MD on call # prior to instructions.  Pt states understanding of instructions. Instructed pt to review instructions again prior to procedure and call main # given if has questions.. Pt states they will.   Instructed pt on where to find instructions on My Chart.

## 2024-05-07 ENCOUNTER — Ambulatory Visit (AMBULATORY_SURGERY_CENTER): Payer: Self-pay | Admitting: Gastroenterology

## 2024-05-07 ENCOUNTER — Encounter: Payer: Self-pay | Admitting: Gastroenterology

## 2024-05-07 VITALS — BP 155/93 | HR 59 | Temp 98.7°F | Resp 11 | Ht 70.0 in | Wt 280.0 lb

## 2024-05-07 DIAGNOSIS — K648 Other hemorrhoids: Secondary | ICD-10-CM

## 2024-05-07 DIAGNOSIS — Z1211 Encounter for screening for malignant neoplasm of colon: Secondary | ICD-10-CM | POA: Diagnosis present

## 2024-05-07 DIAGNOSIS — R195 Other fecal abnormalities: Secondary | ICD-10-CM | POA: Diagnosis not present

## 2024-05-07 DIAGNOSIS — D123 Benign neoplasm of transverse colon: Secondary | ICD-10-CM

## 2024-05-07 DIAGNOSIS — K5730 Diverticulosis of large intestine without perforation or abscess without bleeding: Secondary | ICD-10-CM

## 2024-05-07 DIAGNOSIS — D122 Benign neoplasm of ascending colon: Secondary | ICD-10-CM | POA: Diagnosis not present

## 2024-05-07 DIAGNOSIS — D125 Benign neoplasm of sigmoid colon: Secondary | ICD-10-CM | POA: Diagnosis not present

## 2024-05-07 MED ORDER — SODIUM CHLORIDE 0.9 % IV SOLN: 500.0000 mL | Freq: Once | INTRAVENOUS | Status: DC

## 2024-05-07 NOTE — Progress Notes (Signed)
 History and Physical:  This patient presents for endoscopic testing for: Encounter Diagnosis  Name Primary?   Positive FIT (fecal immunochemical test) Yes    Referred by [facility] for positive FIT test (no add'l clinical information regarding prior CRC screening, if any, in the referral records reviewed today)  Patient reports prior FIT testing, no prior colonoscopy. Denies Fam Hx CRC  Reports occ'l painless rectal bleeding that he attributes to HR  Patient is otherwise without complaints or active issues today.   Past Medical History: Past Medical History:  Diagnosis Date   Back pain, chronic    Cardiac murmur    since child hood takes prophylactic antibiotics before dental procedures   Chronic kidney disease    kidney stones   GERD (gastroesophageal reflux disease)    Hyperlipidemia    Hypertension    Impotence    previously on viagra   Pre-diabetes    Substance abuse (HCC)    ETOH  stopped 1992     Past Surgical History: Past Surgical History:  Procedure Laterality Date   tonsillectomy  1996   adenoids also removed    Allergies: Allergies  Allergen Reactions   Bee Venom Anaphylaxis    Bee stings- Epi pen     Outpatient Meds: Current Outpatient Medications  Medication Sig Dispense Refill   amLODipine (NORVASC) 10 MG tablet Take 10 mg by mouth.     atorvastatin (LIPITOR) 20 MG tablet Take 20 mg by mouth.     cetirizine  (ZYRTEC ) 10 MG tablet Take 1 tablet (10 mg total) by mouth daily. 30 tablet 11   hydrochlorothiazide  (HYDRODIURIL ) 50 MG tablet TAKE 1 TABLET BY MOUTH DAILY 90 tablet 3   potassium chloride SA (KLOR-CON M) 20 MEQ tablet Take 20 mEq by mouth.     traMADol  (ULTRAM ) 50 MG tablet Take 1 tablet (50 mg total) by mouth every 8 (eight) hours as needed. 12 tablet 0   Aspirin-Caffeine (BAYER BACK & BODY PAIN EX ST) 500-32.5 MG TABS Take 2 tablets by mouth daily as needed (Pain). (Patient not taking: Reported on 04/23/2024)     diclofenac Sodium (VOLTAREN) 1  % GEL Apply topically. (Patient not taking: Reported on 04/23/2024)     EPINEPHrine (EPIPEN) 0.3 mg/0.3 mL DEVI Inject 0.3 mg into the muscle once. As directed for bee stings.  Store in cool compartment      fluticasone  (FLONASE ) 50 MCG/ACT nasal spray SHAKE LIQUID AND USE 2 SPRAYS IN EACH NOSTRIL DAILY (Patient not taking: Reported on 04/23/2024) 16 g 0   gabapentin  (NEURONTIN ) 300 MG capsule TAKE 1 CAPSULE(300 MG) BY MOUTH THREE TIMES DAILY (Patient not taking: Reported on 04/23/2024) 90 capsule 0   ibuprofen  (ADVIL ,MOTRIN ) 400 MG tablet TAKE 1 TABLET(400 MG) BY MOUTH EVERY 6 HOURS AS NEEDED FOR FEVER OR HEADACHE (Patient not taking: Reported on 04/23/2024) 385 tablet 0   lidocaine (LIDODERM) 5 % Place onto the skin. (Patient not taking: Reported on 04/23/2024)     meloxicam (MOBIC) 7.5 MG tablet Take 7.5 mg by mouth.     Multiple Vitamin (MULTIVITAMIN WITH MINERALS) TABS tablet Take 1 tablet by mouth daily.     naproxen  (NAPROSYN ) 500 MG tablet Take 1 tablet (500 mg total) by mouth 2 (two) times daily with a meal. (Patient not taking: Reported on 06/25/2018) 30 tablet 0   omeprazole  (PRILOSEC) 40 MG capsule TAKE ONE CAPSULE BY MOUTH DAILY 90 capsule 2   Current Facility-Administered Medications  Medication Dose Route Frequency Provider Last Rate Last Admin  0.9 %  sodium chloride infusion  500 mL Intravenous Once Danis, [place] Jeff L III, MD          ___________________________________________________________________ Objective   Exam:  BP 135/70   Pulse 67   Temp 98.7 F (37.1 C) (Skin)   Ht 5' 10 (1.778 m)   Wt 280 lb (127 kg)   SpO2 97%   BMI 40.18 kg/m   CV: regular , S1/S2 Resp: clear to auscultation bilaterally, normal RR and effort noted GI: soft, no tenderness, with active bowel sounds.   Assessment: Encounter Diagnosis  Name Primary?   Positive FIT (fecal immunochemical test) Yes     Plan: Colonoscopy   The benefits and risks of the planned procedure(s) were described in  detail with the patient or (when appropriate) their health care proxy.  Risks were outlined as including, but not limited to, bleeding, infection, perforation, adverse medication reaction leading to cardiac or pulmonary decompensation, pancreatitis (if ERCP).  The limitation of incomplete mucosal visualization was also discussed.  No guarantees or warranties were given. Patient at increased risk for cardiopulmonary complications of procedure due to medical comorbidities. (Elevated BMI) The patient is appropriate for an endoscopic procedure in the ambulatory setting.   - Lorella Roles, MD

## 2024-05-07 NOTE — Progress Notes (Signed)
 To pacu, VSS. Report to [email or web address]

## 2024-05-07 NOTE — Progress Notes (Signed)
 Called to room to assist during endoscopic procedure.  Patient ID and intended procedure confirmed with present staff. Received instructions for my participation in the procedure from the performing physician.

## 2024-05-07 NOTE — Patient Instructions (Signed)

## 2024-05-07 NOTE — Progress Notes (Signed)
 Pt's states no medical or surgical changes since previsit or office visit.

## 2024-05-07 NOTE — Op Note (Signed)
 [facility] Patient Name: Carlos Knight Procedure Date: 05/07/2024 11:18 AM MRN: [id number] Endoscopist: Ace Abu L. Dominic Knight , MD, [id number] Age: 68 Referring MD:  Date of Birth: [birth year] Gender: Male Account #: [id number] Procedure:                Colonoscopy Indications:              Positive fecal immunochemical test Medicines:                Monitored Anesthesia Care Procedure:                Pre-Anesthesia Assessment:                           - Prior to the procedure, a History and Physical                            was performed, and patient medications and                            allergies were reviewed. The patient's tolerance of                            previous anesthesia was also reviewed. The risks                            and benefits of the procedure and the sedation                            options and risks were discussed with the patient.                            All questions were answered, and informed consent                            was obtained. Prior Anticoagulants: The patient has                            taken no anticoagulant or antiplatelet agents. ASA                            Grade Assessment: III - A patient with severe                            systemic disease. After reviewing the risks and                            benefits, the patient was deemed in satisfactory                            condition to undergo the procedure.                           After obtaining informed consent, the colonoscope  was passed under direct vision. Throughout the                            procedure, the patient's blood pressure, pulse, and                            oxygen saturations were monitored continuously. The                            CF [id number] #[id number] was introduced through the anus                            and advanced to the the cecum, identified by                            appendiceal orifice and  ileocecal valve. The                            colonoscopy was somewhat difficult due to a                            redundant colon and the patient's body habitus.                            Successful completion of the procedure was aided by                            using manual pressure and straightening and                            shortening the scope to obtain bowel loop                            reduction. The patient tolerated the procedure                            well. The quality of the bowel preparation was                            excellent. The ileocecal valve, appendiceal                            orifice, and rectum were photographed. Scope In: 11:51:25 AM Scope Out: 12:08:09 PM Scope Withdrawal Time: 0 hours 10 minutes 1 second  Total Procedure Duration: 0 hours 16 minutes 44 seconds  Findings:                 The perianal and digital rectal examinations were                            normal.                           Repeat examination of right colon under NBI  performed.                           Three sessile polyps were found in the sigmoid                            colon, transverse colon and ascending colon. The                            polyps were diminutive in size. These polyps were                            removed with a cold snare. Resection and retrieval                            were complete.                           Diverticula were found in the entire colon.                           Internal hemorrhoids were found. The hemorrhoids                            were medium-sized.                           The exam was otherwise without abnormality on                            direct and retroflexion views. Complications:            No immediate complications. Estimated Blood Loss:     Estimated blood loss was minimal. Impression:               - Three diminutive polyps in the sigmoid colon, in                             the transverse colon and in the ascending colon,                            removed with a cold snare. Resected and retrieved.                           - Diverticulosis in the entire examined colon.                           - Internal hemorrhoids.                           - The examination was otherwise normal on direct                            and retroflexion views.  Apparent false positive FIT test Recommendation:           - Patient has a contact number available for                            emergencies. The signs and symptoms of potential                            delayed complications were discussed with the                            patient. Return to normal activities tomorrow.                            Written discharge instructions were provided to the                            patient.                           - Resume previous diet.                           - Continue present medications.                           - Await pathology results.                           - Repeat colonoscopy is recommended for                            surveillance. The colonoscopy date will be                            determined after pathology results from today's                            exam become available for review. Patient should                            have a colonoscopy rather than future FIT testing. Carlos Brenner L. Dominic Friendly, MD 05/07/2024 12:13:33 PM This report has been signed electronically.

## 2024-05-08 ENCOUNTER — Telehealth: Payer: Self-pay | Admitting: *Deleted

## 2024-05-08 NOTE — Telephone Encounter (Signed)
 Attempted to call patient for their post-procedure follow up call. No answer. Unable to leave voicemail. If you have any questions or concerns please call the office. Thank you!

## 2024-05-09 ENCOUNTER — Ambulatory Visit: Payer: Self-pay | Admitting: Gastroenterology

## 2024-05-09 LAB — SURGICAL PATHOLOGY
